# Patient Record
Sex: Female | Born: 1990
Health system: Southern US, Community
[De-identification: ages and names within clinical notes are randomized; demographics above are authoritative.]

## PROBLEM LIST (undated history)

## (undated) DIAGNOSIS — D6859 Other primary thrombophilia: Secondary | ICD-10-CM

## (undated) DIAGNOSIS — I82409 Acute embolism and thrombosis of unspecified deep veins of unspecified lower extremity: Secondary | ICD-10-CM

## (undated) HISTORY — DX: Other primary thrombophilia: D68.59

## (undated) HISTORY — PX: OTHER SURGICAL HISTORY: SHX169

---

## 2012-09-03 HISTORY — DX: Maternal care for unspecified type scar from previous cesarean delivery: O34.219

## 2013-02-27 DIAGNOSIS — I82401 Acute embolism and thrombosis of unspecified deep veins of right lower extremity: Secondary | ICD-10-CM | POA: Insufficient documentation

## 2013-03-03 DIAGNOSIS — Z86718 Personal history of other venous thrombosis and embolism: Secondary | ICD-10-CM | POA: Insufficient documentation

## 2013-03-03 DIAGNOSIS — O34219 Maternal care for unspecified type scar from previous cesarean delivery: Secondary | ICD-10-CM | POA: Insufficient documentation

## 2013-07-11 ENCOUNTER — Encounter (HOSPITAL_COMMUNITY): Payer: Self-pay | Admitting: Emergency Medicine

## 2013-07-11 ENCOUNTER — Emergency Department (HOSPITAL_COMMUNITY)
Admission: EM | Admit: 2013-07-11 | Discharge: 2013-07-11 | Disposition: A | Discharge status: Home / Self Care | Payer: Medicaid Other | Attending: Emergency Medicine | Admitting: Emergency Medicine

## 2013-07-11 DIAGNOSIS — Z86718 Personal history of other venous thrombosis and embolism: Secondary | ICD-10-CM | POA: Insufficient documentation

## 2013-07-11 DIAGNOSIS — O9989 Other specified diseases and conditions complicating pregnancy, childbirth and the puerperium: Secondary | ICD-10-CM | POA: Insufficient documentation

## 2013-07-11 DIAGNOSIS — Z79899 Other long term (current) drug therapy: Secondary | ICD-10-CM | POA: Insufficient documentation

## 2013-07-11 DIAGNOSIS — N898 Other specified noninflammatory disorders of vagina: Secondary | ICD-10-CM | POA: Insufficient documentation

## 2013-07-11 HISTORY — DX: Acute embolism and thrombosis of unspecified deep veins of unspecified lower extremity: I82.409

## 2013-07-11 LAB — URINE MICROSCOPIC-ADD ON

## 2013-07-11 LAB — WET PREP, GENITAL
Clue Cells Wet Prep HPF POC: NONE SEEN
Yeast Wet Prep HPF POC: NONE SEEN

## 2013-07-11 LAB — URINALYSIS, ROUTINE W REFLEX MICROSCOPIC
Bilirubin Urine: NEGATIVE
Glucose, UA: NEGATIVE mg/dL
Hgb urine dipstick: NEGATIVE
Protein, ur: NEGATIVE mg/dL
Urobilinogen, UA: 1 mg/dL (ref 0.0–1.0)

## 2013-07-11 NOTE — ED Provider Notes (Signed)
CSN: [id number]     Arrival date & time 07/11/13  1544 History   First MD Initiated Contact with Patient 07/11/13 1604     Chief Complaint  Patient presents with  . Vaginal Discharge    [redacted] weeks pregnant   (Consider location/radiation/quality/duration/timing/severity/associated sxs/prior Treatment) HPI  22 year old G31P2 female who is [redacted] weeks pregnant presents for evaluations of vaginal discharge. Patient states for the past 2 weeks she has noticed intermittent vaginal discharge when she noticed residue on her underwear. Patient felt that it may related to the pregnancy and the baby's position pressing on her bladder, but she does not think that it is urine. Discharge is usually clear occasionally white. No associated fever, chills, nausea or vomiting, diarrhea, chest pain, shortness of breath, abdominal pain, back pain, dysuria, hematuria, or rash  Past Medical History  Diagnosis Date  . DVT (deep venous thrombosis)    Past Surgical History  Procedure Laterality Date  . Cesearan    . Cesarean section     History reviewed. No pertinent family history. History  Substance Use Topics  . Smoking status: Never Smoker   . Smokeless tobacco: Not on file  . Alcohol Use: No   OB History   Grav Para Term Preterm Abortions TAB SAB Ect Mult Living   1              Review of Systems  All other systems reviewed and are negative.    Allergies  Review of patient's allergies indicates no known allergies.  Home Medications   Current Outpatient Rx  Name  Route  Sig  Dispense  Refill  . enoxaparin (LOVENOX) 40 MG/0.4ML injection   Subcutaneous   Inject 40 mg into the skin daily.         . Prenatal Vit-Fe Fumarate-FA (PRENATAL MULTIVITAMIN) TABS tablet   Oral   Take 1 tablet by mouth daily at 12 noon.          BP 105/58  Pulse 74  Temp(Src) 98.7 F (37.1 C) (Oral)  Resp 18  SpO2 99% Physical Exam  Nursing note and vitals reviewed. Constitutional: She appears  well-developed and well-nourished. No distress.  HENT:  Head: Normocephalic and atraumatic.  Eyes: Conjunctivae are normal.  Neck: Normal range of motion. Neck supple.  Cardiovascular: Normal rate and regular rhythm.   Pulmonary/Chest: Effort normal and breath sounds normal. She exhibits no tenderness.  Abdominal: Soft. There is no tenderness.  Abdomen is gravid  Genitourinary: Vagina normal and uterus normal. There is no rash or lesion on the right labia. There is no rash or lesion on the left labia. Cervix exhibits no motion tenderness and no discharge. Right adnexum displays no mass and no tenderness. Left adnexum displays no mass and no tenderness. No erythema, tenderness or bleeding around the vagina. No vaginal discharge found.  Chaperone present:  Moderate white vaginal discharge in vaginal vault, cervical os closed.  No adnexal tenderness, no CMT.  Lymphadenopathy:       Right: No inguinal adenopathy present.       Left: No inguinal adenopathy present.    ED Course  Procedures (including critical care time)  5:32 PM Pt here c/o vaginal discharge.  Is currently [redacted] weeks pregnant, however no abd pain and no vaginal bleeding.  Pt report she is having normal fetal movement.  Is afebrile, VSS.  Not sexually active for over a month.  Remote hx of Chlamydia.    6:45 PM No pain with pelvic exam.  No evidence to suggest PID.  Does have moderate white curd-like discharge in vaginal vault, no odor.  Wet prep result unremarkable, no evidence to suggest UTI.  Recommend pt f/u with OBGYN for further care.  Return precaution discussed.    Labs Review Labs Reviewed  WET PREP, GENITAL - Abnormal; Notable for the following:    WBC, Wet Prep HPF POC FEW (*)    All other components within normal limits  URINALYSIS, ROUTINE W REFLEX MICROSCOPIC - Abnormal; Notable for the following:    Leukocytes, UA SMALL (*)    All other components within normal limits  PREGNANCY, URINE - Abnormal; Notable  for the following:    Preg Test, Ur POSITIVE (*)    All other components within normal limits  URINE MICROSCOPIC-ADD ON - Abnormal; Notable for the following:    Squamous Epithelial / LPF FEW (*)    Bacteria, UA FEW (*)    All other components within normal limits  GC/CHLAMYDIA PROBE AMP  URINE CULTURE   Imaging Review No results found.  EKG Interpretation   None       MDM   1. Vaginal discharge in pregnancy, second trimester    BP 105/58  Pulse 74  Temp(Src) 98.7 F (37.1 C) (Oral)  Resp 18  SpO2 99%  I have reviewed nursing notes and vital signs.  I reviewed available ER/hospitalization records thought the EMR     Fayrene Helper, [place] 07/11/13 1846

## 2013-07-11 NOTE — ED Notes (Signed)
Pt is [redacted] weeks pregnant.  Pt states she is noticing clear drainage in her underwear.  Changes 1 panty-liner per day.  Denies urinary s/s.  Denies abdominal pain.  Pt has not been to her ob b/c she is here visiting.

## 2013-07-12 NOTE — ED Provider Notes (Signed)
Medical screening examination/treatment/procedure(s) were performed by non-physician practitioner and as supervising physician I was immediately available for consultation/collaboration.  EKG Interpretation   None         Darlys Gales, MD 07/12/13 [phone]

## 2013-07-13 LAB — GC/CHLAMYDIA PROBE AMP
CT Probe RNA: NEGATIVE
GC Probe RNA: NEGATIVE

## 2013-07-13 LAB — URINE CULTURE

## 2013-09-04 DIAGNOSIS — Z8619 Personal history of other infectious and parasitic diseases: Secondary | ICD-10-CM | POA: Insufficient documentation

## 2013-09-23 DIAGNOSIS — Z98891 History of uterine scar from previous surgery: Secondary | ICD-10-CM

## 2013-09-23 HISTORY — DX: History of uterine scar from previous surgery: Z98.891

## 2013-09-25 DIAGNOSIS — O0990 Supervision of high risk pregnancy, unspecified, unspecified trimester: Secondary | ICD-10-CM | POA: Insufficient documentation

## 2013-12-08 DIAGNOSIS — Z30017 Encounter for initial prescription of implantable subdermal contraceptive: Secondary | ICD-10-CM | POA: Insufficient documentation

## 2014-04-06 ENCOUNTER — Emergency Department (HOSPITAL_COMMUNITY)
Admission: EM | Admit: 2014-04-06 | Discharge: 2014-04-06 | Disposition: A | Discharge status: Home / Self Care | Payer: Managed Care, Other (non HMO) | Attending: Emergency Medicine | Admitting: Emergency Medicine

## 2014-04-06 ENCOUNTER — Encounter (HOSPITAL_COMMUNITY): Payer: Self-pay | Admitting: Emergency Medicine

## 2014-04-06 DIAGNOSIS — Z86718 Personal history of other venous thrombosis and embolism: Secondary | ICD-10-CM | POA: Insufficient documentation

## 2014-04-06 DIAGNOSIS — Z79899 Other long term (current) drug therapy: Secondary | ICD-10-CM | POA: Insufficient documentation

## 2014-04-06 DIAGNOSIS — R300 Dysuria: Secondary | ICD-10-CM | POA: Insufficient documentation

## 2014-04-06 DIAGNOSIS — Z792 Long term (current) use of antibiotics: Secondary | ICD-10-CM | POA: Insufficient documentation

## 2014-04-06 DIAGNOSIS — Z3202 Encounter for pregnancy test, result negative: Secondary | ICD-10-CM | POA: Insufficient documentation

## 2014-04-06 DIAGNOSIS — N390 Urinary tract infection, site not specified: Secondary | ICD-10-CM | POA: Insufficient documentation

## 2014-04-06 LAB — URINALYSIS, ROUTINE W REFLEX MICROSCOPIC
BILIRUBIN URINE: NEGATIVE
Glucose, UA: NEGATIVE mg/dL
Ketones, ur: NEGATIVE mg/dL
Nitrite: POSITIVE — AB
PH: 6 (ref 5.0–8.0)
Protein, ur: 30 mg/dL — AB
SPECIFIC GRAVITY, URINE: 1.025 (ref 1.005–1.030)
Urobilinogen, UA: 0.2 mg/dL (ref 0.0–1.0)

## 2014-04-06 LAB — URINE MICROSCOPIC-ADD ON

## 2014-04-06 LAB — PREGNANCY, URINE: Preg Test, Ur: NEGATIVE

## 2014-04-06 MED ORDER — CEPHALEXIN 500 MG PO CAPS
500.0000 mg | ORAL_CAPSULE | Freq: Once | ORAL | Status: AC
  Administered 2014-04-06: 500 mg via ORAL
  Filled 2014-04-06: qty 1

## 2014-04-06 MED ORDER — CEPHALEXIN 500 MG PO CAPS: 500.0000 mg | ORAL_CAPSULE | Freq: Two times a day (BID) | ORAL | Status: DC

## 2014-04-06 NOTE — ED Provider Notes (Signed)
Medical screening examination/treatment/procedure(s) were performed by non-physician practitioner and as supervising physician I was immediately available for consultation/collaboration.   EKG Interpretation None      Devoria AlbeIva Madinah Quarry, MD, Armando GangFACEP   Ward GivensIva L Elie Gragert, MD 04/06/14 Mikle [id number]

## 2014-04-06 NOTE — ED Provider Notes (Signed)
CSN: [id number]     Arrival date & time 04/06/14  1750 History   First MD Initiated Contact with Patient 04/06/14 1804     Chief Complaint  Patient presents with  . Dysuria     (Consider location/radiation/quality/duration/timing/severity/associated sxs/prior Treatment) HPI   Brandy Barker is a 23 y.o. female presenting with a 1 week history or dysuria,  Describing increased urinary frequency, burning pain with urination, urgency and never feeling she has completely emptied her bladder.  She has taken aso with transient relief yesterday.  She denies fevers or chills, hematuria, nausea, vomiting, abdominal pain, back pain and vaginal discharge.  She is currently menstruating and denies pregnancy.    Past Medical History  Diagnosis Date  . DVT (deep venous thrombosis)    Past Surgical History  Procedure Laterality Date  . Cesearan    . Cesarean section     History reviewed. No pertinent family history. History  Substance Use Topics  . Smoking status: Never Smoker   . Smokeless tobacco: Not on file  . Alcohol Use: No   OB History   Grav Para Term Preterm Abortions TAB SAB Ect Mult Living   1              Review of Systems  Constitutional: Negative for fever.  HENT: Negative for congestion and sore throat.   Eyes: Negative.   Respiratory: Negative for chest tightness and shortness of breath.   Cardiovascular: Negative for chest pain.  Gastrointestinal: Negative for nausea and abdominal pain.  Genitourinary: Positive for dysuria, urgency and frequency. Negative for hematuria, vaginal discharge and pelvic pain.  Musculoskeletal: Negative for arthralgias, joint swelling and neck pain.  Skin: Negative.  Negative for rash and wound.  Neurological: Negative for dizziness, weakness, light-headedness, numbness and headaches.  Psychiatric/Behavioral: Negative.       Allergies  Review of patient's allergies indicates no known allergies.  Home Medications   Prior to  Admission medications   Medication Sig Start Date End Date Taking? Authorizing Provider  cephALEXin (KEFLEX) 500 MG capsule Take 1 capsule (500 mg total) by mouth 2 (two) times daily. 04/06/14   Burgess Amor, PA-C  enoxaparin (LOVENOX) 40 MG/0.4ML injection Inject 40 mg into the skin daily.    Historical Provider, MD  Prenatal Vit-Fe Fumarate-FA (PRENATAL MULTIVITAMIN) TABS tablet Take 1 tablet by mouth daily at 12 noon.    Historical Provider, MD   BP 127/74  Pulse 77  Temp(Src) 98.5 F (36.9 C) (Oral)  Resp 20  Ht 5\' 3"  (1.6 m)  Wt 175 lb (79.379 kg)  BMI 31.01 kg/m2  SpO2 100%  LMP 04/04/2014  Breastfeeding? Unknown Physical Exam  Nursing note and vitals reviewed. Constitutional: She appears well-developed and well-nourished.  HENT:  Head: Normocephalic and atraumatic.  Eyes: Conjunctivae are normal.  Neck: Normal range of motion.  Cardiovascular: Normal rate, regular rhythm, normal heart sounds and intact distal pulses.   Pulmonary/Chest: Effort normal and breath sounds normal. She has no wheezes.  Abdominal: Soft. Bowel sounds are normal. She exhibits no distension. There is no tenderness. There is no rebound and no guarding.  Musculoskeletal: Normal range of motion.  Neurological: She is alert.  Skin: Skin is warm and dry.  Psychiatric: She has a normal mood and affect.    ED Course  Procedures (including critical care time) Labs Review Labs Reviewed  URINALYSIS, ROUTINE W REFLEX MICROSCOPIC - Abnormal; Notable for the following:    APPearance HAZY (*)    Hgb urine dipstick  MODERATE (*)    Protein, ur 30 (*)    Nitrite POSITIVE (*)    Leukocytes, UA SMALL (*)    All other components within normal limits  URINE MICROSCOPIC-ADD ON - Abnormal; Notable for the following:    Squamous Epithelial / LPF MANY (*)    Bacteria, UA MANY (*)    All other components within normal limits  URINE CULTURE  PREGNANCY, URINE    Imaging Review No results found.   EKG  Interpretation None      MDM   Final diagnoses:  UTI (lower urinary tract infection)    Urine culture ordered.  Keflex prescribed, first dose given here.  Referrals given for obtaining pcp, discussed return precautions.    Patients labs and/or radiological studies were viewed and considered during the medical decision making and disposition process.     Burgess AmorJulie Giuliana Handyside, PA-C 04/06/14 1858

## 2014-04-06 NOTE — ED Notes (Signed)
Dysuria, frequency of urination.  No NV or fever.

## 2014-04-06 NOTE — Discharge Instructions (Signed)

## 2014-04-09 LAB — URINE CULTURE

## 2014-04-10 ENCOUNTER — Telehealth (HOSPITAL_BASED_OUTPATIENT_CLINIC_OR_DEPARTMENT_OTHER): Payer: Self-pay | Admitting: Emergency Medicine

## 2014-04-10 NOTE — Telephone Encounter (Signed)
Post ED Visit - Positive Culture Follow-up  Culture report reviewed by antimicrobial stewardship pharmacist: []  Wes Dulaney, Pharm.D., BCPS []  Celedonio MiyamotoJeremy Frens, Pharm.D., BCPS []  Georgina PillionElizabeth Martin, Pharm.D., BCPS []  DurantMinh Pham, [address] Rainbow BoulevardPharm.D., BCPS, AAHIVP [x]  Estella HuskMichelle Turner, Pharm.D., BCPS, AAHIVP []  Red ChristiansSamson Lee, Pharm.D. []  Tennis Mustassie Stewart, Pharm.D.  Positive urine culture Treated with Keflex, organism sensitive to the same and no further patient follow-up is required at this time.  Marcelle OverlieHolland, Jenel LucksKylie 04/10/2014, 10:26 AM

## 2014-05-24 ENCOUNTER — Encounter (HOSPITAL_COMMUNITY): Payer: Self-pay | Admitting: Emergency Medicine

## 2014-05-24 ENCOUNTER — Emergency Department (HOSPITAL_COMMUNITY)
Admission: EM | Admit: 2014-05-24 | Discharge: 2014-05-24 | Disposition: A | Discharge status: Home / Self Care | Payer: Managed Care, Other (non HMO) | Attending: Emergency Medicine | Admitting: Emergency Medicine

## 2014-05-24 DIAGNOSIS — A599 Trichomoniasis, unspecified: Secondary | ICD-10-CM

## 2014-05-24 DIAGNOSIS — Z86718 Personal history of other venous thrombosis and embolism: Secondary | ICD-10-CM | POA: Insufficient documentation

## 2014-05-24 DIAGNOSIS — B9689 Other specified bacterial agents as the cause of diseases classified elsewhere: Secondary | ICD-10-CM | POA: Diagnosis not present

## 2014-05-24 DIAGNOSIS — N760 Acute vaginitis: Secondary | ICD-10-CM | POA: Diagnosis not present

## 2014-05-24 DIAGNOSIS — N898 Other specified noninflammatory disorders of vagina: Secondary | ICD-10-CM | POA: Diagnosis present

## 2014-05-24 DIAGNOSIS — A499 Bacterial infection, unspecified: Secondary | ICD-10-CM | POA: Insufficient documentation

## 2014-05-24 DIAGNOSIS — A5901 Trichomonal vulvovaginitis: Secondary | ICD-10-CM | POA: Insufficient documentation

## 2014-05-24 DIAGNOSIS — Z79899 Other long term (current) drug therapy: Secondary | ICD-10-CM | POA: Diagnosis not present

## 2014-05-24 DIAGNOSIS — Z792 Long term (current) use of antibiotics: Secondary | ICD-10-CM | POA: Diagnosis not present

## 2014-05-24 DIAGNOSIS — Z3202 Encounter for pregnancy test, result negative: Secondary | ICD-10-CM | POA: Diagnosis not present

## 2014-05-24 DIAGNOSIS — Z9889 Other specified postprocedural states: Secondary | ICD-10-CM | POA: Insufficient documentation

## 2014-05-24 LAB — URINALYSIS, ROUTINE W REFLEX MICROSCOPIC
Bilirubin Urine: NEGATIVE
GLUCOSE, UA: NEGATIVE mg/dL
KETONES UR: NEGATIVE mg/dL
LEUKOCYTES UA: NEGATIVE
NITRITE: NEGATIVE
PROTEIN: NEGATIVE mg/dL
Specific Gravity, Urine: 1.025 (ref 1.005–1.030)
Urobilinogen, UA: 0.2 mg/dL (ref 0.0–1.0)
pH: 6 (ref 5.0–8.0)

## 2014-05-24 LAB — WET PREP, GENITAL: YEAST WET PREP: NONE SEEN

## 2014-05-24 LAB — URINE MICROSCOPIC-ADD ON

## 2014-05-24 LAB — PREGNANCY, URINE: PREG TEST UR: NEGATIVE

## 2014-05-24 MED ORDER — METRONIDAZOLE 500 MG PO TABS
2000.0000 mg | ORAL_TABLET | Freq: Once | ORAL | Status: AC
  Administered 2014-05-24: 2000 mg via ORAL
  Filled 2014-05-24: qty 4

## 2014-05-24 NOTE — ED Notes (Signed)
Pt c/o thicker foul smelling discharge and pelvic pain x 1 weeks.

## 2014-05-24 NOTE — Discharge Instructions (Signed)
Bacterial Vaginosis °Bacterial vaginosis is a vaginal infection that occurs when the normal balance of bacteria in the vagina is disrupted. It results from an overgrowth of certain bacteria. This is the most common vaginal infection in women of childbearing age. Treatment is important to prevent complications, especially in pregnant women, as it can cause a premature delivery. °CAUSES  °Bacterial vaginosis is caused by an increase in harmful bacteria that are normally present in smaller amounts in the vagina. Several different kinds of bacteria can cause bacterial vaginosis. However, the reason that the condition develops is not fully understood. °RISK FACTORS °Certain activities or behaviors can put you at an increased risk of developing bacterial vaginosis, including: °· Having a new sex partner or multiple sex partners. °· Douching. °· Using an intrauterine device (IUD) for contraception. °Women do not get bacterial vaginosis from toilet seats, bedding, swimming pools, or contact with objects around them. °SIGNS AND SYMPTOMS  °Some women with bacterial vaginosis have no signs or symptoms. Common symptoms include: °· Grey vaginal discharge. °· A fishlike odor with discharge, especially after sexual intercourse. °· Itching or burning of the vagina and vulva. °· Burning or pain with urination. °DIAGNOSIS  °Your health care provider will take a medical history and examine the vagina for signs of bacterial vaginosis. A sample of vaginal fluid may be taken. Your health care provider will look at this sample under a microscope to check for bacteria and abnormal cells. A vaginal pH test may also be done.  °TREATMENT  °Bacterial vaginosis may be treated with antibiotic medicines. These may be given in the form of a pill or a vaginal cream. A second round of antibiotics may be prescribed if the condition comes back after treatment.  °HOME CARE INSTRUCTIONS  °· Only take over-the-counter or prescription medicines as  directed by your health care provider. °· If antibiotic medicine was prescribed, take it as directed. Make sure you finish it even if you start to feel better. °· Do not have sex until treatment is completed. °· Tell all sexual partners that you have a vaginal infection. They should see their health care provider and be treated if they have problems, such as a mild rash or itching. °· Practice safe sex by using condoms and only having one sex partner. °SEEK MEDICAL CARE IF:  °· Your symptoms are not improving after 3 days of treatment. °· You have increased discharge or pain. °· You have a fever. °MAKE SURE YOU:  °· Understand these instructions. °· Will watch your condition. °· Will get help right away if you are not doing well or get worse. °FOR MORE INFORMATION  °Centers for Disease Control and Prevention, Division of STD Prevention: www.cdc.gov/std °American Sexual Health Association (ASHA): www.ashastd.org  °Document Released: 08/20/2005 Document Revised: 06/10/2013 Document Reviewed: 04/01/2013 °ExitCare® Patient Information ©2015 [employer]. This information is not intended to replace advice given to you by your health care provider. Make sure you discuss any questions you have with your health care provider. °Trichomoniasis °Trichomoniasis is an infection caused by an organism called Trichomonas. The infection can affect both women and men. In women, the outer female genitalia and the vagina are affected. In men, the penis is mainly affected, but the prostate and other reproductive organs can also be involved. Trichomoniasis is a sexually transmitted infection (STI) and is most often passed to another person through sexual contact.  °RISK FACTORS °· Having unprotected sexual intercourse. °· Having sexual intercourse with an infected partner. °SIGNS AND SYMPTOMS  °  Symptoms of trichomoniasis in women include: °· Abnormal gray-green frothy vaginal discharge. °· Itching and irritation of the  vagina. °· Itching and irritation of the area outside the vagina. °Symptoms of trichomoniasis in men include:  °· Penile discharge with or without pain. °· Pain during urination. This results from inflammation of the urethra. °DIAGNOSIS  °Trichomoniasis may be found during a Pap test or physical exam. Your health care provider may use one of the following methods to help diagnose this infection: °· Examining vaginal discharge under a microscope. For men, urethral discharge would be examined. °· Testing the pH of the vagina with a test tape. °· Using a vaginal swab test that checks for the Trichomonas organism. A test is available that provides results within a few minutes. °· Doing a culture test for the organism. This is not usually needed. °TREATMENT  °· You may be given medicine to fight the infection. Women should inform their health care provider if they could be or are pregnant. Some medicines used to treat the infection should not be taken during pregnancy. °· Your health care provider may recommend over-the-counter medicines or creams to decrease itching or irritation. °· Your sexual partner will need to be treated if infected. °HOME CARE INSTRUCTIONS  °· Take medicines only as directed by your health care provider. °· Take over-the-counter medicine for itching or irritation as directed by your health care provider. °· Do not have sexual intercourse while you have the infection. °· Women should not douche or wear tampons while they have the infection. °· Discuss your infection with your partner. Your partner may have gotten the infection from you, or you may have gotten it from your partner. °· Have your sex partner get examined and treated if necessary. °· Practice safe, informed, and protected sex. °· See your health care provider for other STI testing. °SEEK MEDICAL CARE IF:  °· You still have symptoms after you finish your medicine. °· You develop abdominal pain. °· You have pain when you urinate. °· You  have bleeding after sexual intercourse. °· You develop a rash. °· Your medicine makes you sick or makes you throw up (vomit). °MAKE SURE YOU: °· Understand these instructions. °· Will watch your condition. °· Will get help right away if you are not doing well or get worse. °Document Released: 02/13/2001 Document Revised: 01/04/2014 Document Reviewed: 06/01/2013 °ExitCare® Patient Information ©2015 [employer]. This information is not intended to replace advice given to you by your health care provider. Make sure you discuss any questions you have with your health care provider. ° °

## 2014-05-24 NOTE — ED Provider Notes (Signed)
CSN: [id number]     Arrival date & time 05/24/14  1741 History   First MD Initiated Contact with Patient 05/24/14 1758     Chief Complaint  Patient presents with  . Vaginal Discharge     (Consider location/radiation/quality/duration/timing/severity/associated sxs/prior Treatment) HPI Comments: Patient complains of two-week history of foul-smelling vaginal discharge with fishy odor. This is similar to when she had bacterial vaginosis. She endorses intermittent lower abdominal cramps similar to her menstrual cramps. Denies any fever, chills, nausea or vomiting. Good by mouth intake and urine output. No change in bowel habits. She has a history of DVT. No longer on Lovenox. She is actually active with her boyfriend only. Denies any abnormal vaginal bleeding.  The history is provided by the patient.    Past Medical History  Diagnosis Date  . DVT (deep venous thrombosis)    Past Surgical History  Procedure Laterality Date  . Cesearan    . Cesarean section     No family history on file. History  Substance Use Topics  . Smoking status: Never Smoker   . Smokeless tobacco: Not on file  . Alcohol Use: No   OB History   Grav Para Term Preterm Abortions TAB SAB Ect Mult Living   1              Review of Systems  Constitutional: Negative for fever, activity change and appetite change.  Respiratory: Negative for cough, chest tightness and shortness of breath.   Cardiovascular: Negative for chest pain.  Gastrointestinal: Positive for abdominal pain. Negative for nausea and vomiting.  Genitourinary: Positive for vaginal discharge. Negative for dysuria and hematuria.  Musculoskeletal: Negative for arthralgias, back pain and myalgias.  Skin: Negative for rash.  Neurological: Negative for dizziness, weakness and headaches.  A complete 10 system review of systems was obtained and all systems are negative except as noted in the HPI and PMH.      Allergies  Review of patient's allergies  indicates no known allergies.  Home Medications   Prior to Admission medications   Medication Sig Start Date End Date Taking? Authorizing Provider  cephALEXin (KEFLEX) 500 MG capsule Take 1 capsule (500 mg total) by mouth 2 (two) times daily. 04/06/14   Burgess Amor, PA-C  enoxaparin (LOVENOX) 40 MG/0.4ML injection Inject 40 mg into the skin daily.    Historical Provider, MD  Prenatal Vit-Fe Fumarate-FA (PRENATAL MULTIVITAMIN) TABS tablet Take 1 tablet by mouth daily at 12 noon.    Historical Provider, MD   BP 133/82  Pulse 70  Temp(Src) 99 F (37.2 C)  Resp 18  Ht  (1.6 m)  Wt 176 lb (79.833 kg)  BMI 31.18 kg/m2  SpO2 100%  LMP 05/04/2014 Physical Exam  Nursing note and vitals reviewed. Constitutional: She is oriented to person, place, and time. She appears well-developed and well-nourished. No distress.  HENT:  Head: Normocephalic and atraumatic.  Mouth/Throat: Oropharynx is clear and moist. No oropharyngeal exudate.  Eyes: Conjunctivae and EOM are normal. Pupils are equal, round, and reactive to light.  Neck: Normal range of motion. Neck supple.  No meningismus.  Cardiovascular: Normal rate, regular rhythm, normal heart sounds and intact distal pulses.   No murmur heard. Pulmonary/Chest: Effort normal and breath sounds normal. No respiratory distress.  Abdominal: Soft. There is no tenderness. There is no rebound and no guarding.  No pain at McBurney's point.  Genitourinary:  Chaperone present. Normal external genitalia. No CMT. No adnexal tenderness. Minimal white discharge  Musculoskeletal:  Normal range of motion. She exhibits no edema and no tenderness.  Neurological: She is alert and oriented to person, place, and time. No cranial nerve deficit. She exhibits normal muscle tone. Coordination normal.  No ataxia on finger to nose bilaterally. No pronator drift. 5/5 strength throughout. CN 2-12 intact. Negative Romberg. Equal grip strength. Sensation intact. Gait is normal.    Skin: Skin is warm.  Psychiatric: She has a normal mood and affect. Her behavior is normal.    ED Course  Procedures (including critical care time) Labs Review Labs Reviewed  WET PREP, GENITAL - Abnormal; Notable for the following:    Trich, Wet Prep FEW (*)    Clue Cells Wet Prep HPF POC FEW (*)    WBC, Wet Prep HPF POC FEW (*)    All other components within normal limits  URINALYSIS, ROUTINE W REFLEX MICROSCOPIC - Abnormal; Notable for the following:    Hgb urine dipstick TRACE (*)    All other components within normal limits  URINE MICROSCOPIC-ADD ON - Abnormal; Notable for the following:    Squamous Epithelial / LPF FEW (*)    All other components within normal limits  GC/CHLAMYDIA PROBE AMP  PREGNANCY, URINE    Imaging Review No results found.   EKG Interpretation None      MDM   Final diagnoses:  Bacterial vaginosis  Trichomonas infection   2 weeks of vaginal discharge. No significant pain. Pelvic exam benign. No pain at McBurney's point.  Will treat for Trichomonas and bacterial vaginosis. Patient is not breast-feeding. She is given 2 g of Flagyl by mouth.  UA is negative. HCG negative.  Patient to follow up with PCP. Return precautions discussed.     Glynn Octave, MD 05/24/14 2159

## 2014-05-26 LAB — GC/CHLAMYDIA PROBE AMP
CT Probe RNA: NEGATIVE
GC Probe RNA: NEGATIVE

## 2014-07-05 ENCOUNTER — Encounter (HOSPITAL_COMMUNITY): Payer: Self-pay | Admitting: Emergency Medicine

## 2014-08-03 ENCOUNTER — Encounter (HOSPITAL_COMMUNITY): Payer: Self-pay | Admitting: Emergency Medicine

## 2014-08-03 ENCOUNTER — Emergency Department (HOSPITAL_COMMUNITY)
Admission: EM | Admit: 2014-08-03 | Discharge: 2014-08-03 | Disposition: A | Discharge status: Home / Self Care | Payer: Managed Care, Other (non HMO) | Attending: Emergency Medicine | Admitting: Emergency Medicine

## 2014-08-03 DIAGNOSIS — Z86718 Personal history of other venous thrombosis and embolism: Secondary | ICD-10-CM | POA: Insufficient documentation

## 2014-08-03 DIAGNOSIS — Z3202 Encounter for pregnancy test, result negative: Secondary | ICD-10-CM | POA: Insufficient documentation

## 2014-08-03 DIAGNOSIS — N760 Acute vaginitis: Secondary | ICD-10-CM | POA: Insufficient documentation

## 2014-08-03 DIAGNOSIS — B9689 Other specified bacterial agents as the cause of diseases classified elsewhere: Secondary | ICD-10-CM

## 2014-08-03 DIAGNOSIS — Z792 Long term (current) use of antibiotics: Secondary | ICD-10-CM | POA: Insufficient documentation

## 2014-08-03 DIAGNOSIS — N898 Other specified noninflammatory disorders of vagina: Secondary | ICD-10-CM | POA: Diagnosis present

## 2014-08-03 LAB — URINALYSIS, ROUTINE W REFLEX MICROSCOPIC
Bilirubin Urine: NEGATIVE
Glucose, UA: NEGATIVE mg/dL
Hgb urine dipstick: NEGATIVE
Ketones, ur: 15 mg/dL — AB
LEUKOCYTES UA: NEGATIVE
Nitrite: NEGATIVE
PROTEIN: NEGATIVE mg/dL
Specific Gravity, Urine: 1.025 (ref 1.005–1.030)
Urobilinogen, UA: 1 mg/dL (ref 0.0–1.0)
pH: 7 (ref 5.0–8.0)

## 2014-08-03 LAB — WET PREP, GENITAL
Trich, Wet Prep: NONE SEEN
YEAST WET PREP: NONE SEEN

## 2014-08-03 LAB — PREGNANCY, URINE: Preg Test, Ur: NEGATIVE

## 2014-08-03 MED ORDER — METRONIDAZOLE 500 MG PO TABS: 500.0000 mg | ORAL_TABLET | Freq: Two times a day (BID) | ORAL | Status: DC

## 2014-08-03 NOTE — ED Notes (Signed)
Pt reports vaginal discharge and odor x2 weeks. Pt reports history of same. Pt denies any fever,n/v,abdominal pain.

## 2014-08-03 NOTE — Discharge Instructions (Signed)
Bacterial Vaginosis Bacterial vaginosis is a vaginal infection that occurs when the normal balance of bacteria in the vagina is disrupted. It results from an overgrowth of certain bacteria. This is the most common vaginal infection in women of childbearing age. Treatment is important to prevent complications, especially in pregnant women, as it can cause a premature delivery. CAUSES  Bacterial vaginosis is caused by an increase in harmful bacteria that are normally present in smaller amounts in the vagina. Several different kinds of bacteria can cause bacterial vaginosis. However, the reason that the condition develops is not fully understood. RISK FACTORS Certain activities or behaviors can put you at an increased risk of developing bacterial vaginosis, including:  Having a new sex partner or multiple sex partners.  Douching.  Using an intrauterine device (IUD) for contraception. Women do not get bacterial vaginosis from toilet seats, bedding, swimming pools, or contact with objects around them. SIGNS AND SYMPTOMS  Some women with bacterial vaginosis have no signs or symptoms. Common symptoms include:  Grey vaginal discharge.  A fishlike odor with discharge, especially after sexual intercourse.  Itching or burning of the vagina and vulva.  Burning or pain with urination. DIAGNOSIS  Your health care provider will take a medical history and examine the vagina for signs of bacterial vaginosis. A sample of vaginal fluid may be taken. Your health care provider will look at this sample under a microscope to check for bacteria and abnormal cells. A vaginal pH test may also be done.  TREATMENT  Bacterial vaginosis may be treated with antibiotic medicines. These may be given in the form of a pill or a vaginal cream. A second round of antibiotics may be prescribed if the condition comes back after treatment.  HOME CARE INSTRUCTIONS   Only take over-the-counter or prescription medicines as  directed by your health care provider.  If antibiotic medicine was prescribed, take it as directed. Make sure you finish it even if you start to feel better.  Do not have sex until treatment is completed.  Tell all sexual partners that you have a vaginal infection. They should see their health care provider and be treated if they have problems, such as a mild rash or itching.  Practice safe sex by using condoms and only having one sex partner. SEEK MEDICAL CARE IF:   Your symptoms are not improving after 3 days of treatment.  You have increased discharge or pain.  You have a fever. MAKE SURE YOU:   Understand these instructions.  Will watch your condition.  Will get help right away if you are not doing well or get worse. FOR MORE INFORMATION  Centers for Disease Control and Prevention, Division of STD Prevention: www.cdc.gov/std American Sexual Health Association (ASHA): www.ashastd.org  Document Released: 08/20/2005 Document Revised: 06/10/2013 Document Reviewed: 04/01/2013 ExitCare Patient Information 2015 [employer]. This information is not intended to replace advice given to you by your health care provider. Make sure you discuss any questions you have with your health care provider.  

## 2014-08-03 NOTE — ED Provider Notes (Signed)
CSN: [id number]     Arrival date & time 08/03/14  1140 History   First MD Initiated Contact with Patient 08/03/14 1151     Chief Complaint  Patient presents with  . Vaginal Discharge     (Consider location/radiation/quality/duration/timing/severity/associated sxs/prior Treatment) HPI Comments: Comes in with complaints of vaginal discharge and odor times 2 weeks. Pt states that it is similar to previous episodes of bv. Denies abdominal pain or dysuria. Previous history of std a couple of months ago.   The history is provided by the patient. No language interpreter was used.    Past Medical History  Diagnosis Date  . DVT (deep venous thrombosis)    Past Surgical History  Procedure Laterality Date  . Cesearan    . Cesarean section     History reviewed. No pertinent family history. History  Substance Use Topics  . Smoking status: Never Smoker   . Smokeless tobacco: Not on file  . Alcohol Use: No   OB History    Gravida Para Term Preterm AB TAB SAB Ectopic Multiple Living   1              Review of Systems  All other systems reviewed and are negative.     Allergies  Review of patient's allergies indicates no known allergies.  Home Medications   Prior to Admission medications   Medication Sig Start Date End Date Taking? Authorizing Provider  etonogestrel (NEXPLANON) 68 MG IMPL implant 1 each by Subdermal route once.   Yes Historical Provider, MD  cephALEXin (KEFLEX) 500 MG capsule Take 1 capsule (500 mg total) by mouth 2 (two) times daily. Patient not taking: Reported on 08/03/2014 04/06/14   Burgess AmorJulie Idol, PA-C   BP 111/82 mmHg  Pulse 78  Temp(Src) 98.8 F (37.1 C) (Oral)  Resp 18  Ht 5\' 3"  (1.6 m)  Wt 175 lb (79.379 kg)  BMI 31.01 kg/m2  SpO2 100%  Breastfeeding? No Physical Exam  Constitutional: She is oriented to person, place, and time. She appears well-developed and well-nourished.  Cardiovascular: Normal rate and regular rhythm.   Pulmonary/Chest: Effort  normal.  Genitourinary:  Malodorous discharge  Musculoskeletal: Normal range of motion.  Neurological: She is alert and oriented to person, place, and time.  Skin: Skin is warm and dry.  Psychiatric: She has a normal mood and affect.  Nursing note and vitals reviewed.   ED Course  Procedures (including critical care time) Labs Review Labs Reviewed  WET PREP, GENITAL - Abnormal; Notable for the following:    Clue Cells Wet Prep HPF POC MANY (*)    WBC, Wet Prep HPF POC FEW (*)    All other components within normal limits  URINALYSIS, ROUTINE W REFLEX MICROSCOPIC - Abnormal; Notable for the following:    Ketones, ur 15 (*)    All other components within normal limits  GC/CHLAMYDIA PROBE AMP  PREGNANCY, URINE    Imaging Review No results found.   EKG Interpretation None      MDM   Final diagnoses:  BV (bacterial vaginosis)    Will treat for bv. Cultures sent    Teressa LowerVrinda Gaelyn Tukes, NP 08/03/14 1504  Benny LennertJoseph L Zammit, MD 08/03/14 [phone]

## 2014-08-04 LAB — GC/CHLAMYDIA PROBE AMP
CT Probe RNA: NEGATIVE
GC Probe RNA: NEGATIVE

## 2014-09-03 DIAGNOSIS — D6859 Other primary thrombophilia: Secondary | ICD-10-CM

## 2014-09-03 HISTORY — DX: Other primary thrombophilia: D68.59

## 2014-11-30 ENCOUNTER — Encounter (HOSPITAL_COMMUNITY): Payer: Self-pay

## 2014-11-30 ENCOUNTER — Emergency Department (HOSPITAL_COMMUNITY)
Admission: EM | Admit: 2014-11-30 | Discharge: 2014-11-30 | Disposition: A | Discharge status: Home / Self Care | Payer: Managed Care, Other (non HMO) | Attending: Emergency Medicine | Admitting: Emergency Medicine

## 2014-11-30 DIAGNOSIS — Z86718 Personal history of other venous thrombosis and embolism: Secondary | ICD-10-CM | POA: Insufficient documentation

## 2014-11-30 DIAGNOSIS — G44319 Acute post-traumatic headache, not intractable: Secondary | ICD-10-CM | POA: Diagnosis not present

## 2014-11-30 DIAGNOSIS — Z793 Long term (current) use of hormonal contraceptives: Secondary | ICD-10-CM | POA: Insufficient documentation

## 2014-11-30 DIAGNOSIS — Z87828 Personal history of other (healed) physical injury and trauma: Secondary | ICD-10-CM | POA: Diagnosis not present

## 2014-11-30 DIAGNOSIS — R51 Headache: Secondary | ICD-10-CM | POA: Diagnosis present

## 2014-11-30 DIAGNOSIS — Z87891 Personal history of nicotine dependence: Secondary | ICD-10-CM | POA: Diagnosis not present

## 2014-11-30 DIAGNOSIS — Z79899 Other long term (current) drug therapy: Secondary | ICD-10-CM | POA: Insufficient documentation

## 2014-11-30 MED ORDER — IBUPROFEN 600 MG PO TABS: 600.0000 mg | ORAL_TABLET | Freq: Four times a day (QID) | ORAL | Status: DC | PRN

## 2014-11-30 MED ORDER — ACETAMINOPHEN 500 MG PO TABS
1000.0000 mg | ORAL_TABLET | Freq: Once | ORAL | Status: AC
  Administered 2014-11-30: 1000 mg via ORAL
  Filled 2014-11-30: qty 2

## 2014-11-30 MED ORDER — IBUPROFEN 800 MG PO TABS
800.0000 mg | ORAL_TABLET | Freq: Once | ORAL | Status: AC
  Administered 2014-11-30: 800 mg via ORAL
  Filled 2014-11-30: qty 1
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
## 2014-11-30 NOTE — Discharge Instructions (Signed)
Head Injury You have a head injury. Headaches and throwing up (vomiting) are common after a head injury. It should be easy to wake up from sleeping. Sometimes you must stay in the hospital. Most problems happen within the first 24 hours. Side effects may occur up to 7-10 days after the injury.  WHAT ARE THE TYPES OF HEAD INJURIES? Head injuries can be as minor as a bump. Some head injuries can be more severe. More severe head injuries include:  A jarring injury to the brain (concussion).  A bruise of the brain (contusion). This mean there is bleeding in the brain that can cause swelling.  A cracked skull (skull fracture).  Bleeding in the brain that collects, clots, and forms a bump (hematoma). WHEN SHOULD I GET HELP RIGHT AWAY?   You are confused or sleepy.  You cannot be woken up.  You feel sick to your stomach (nauseous) or keep throwing up (vomiting).  Your dizziness or unsteadiness is getting worse.  You have very bad, lasting headaches that are not helped by medicine. Take medicines only as told by your doctor.  You cannot use your arms or legs like normal.  You cannot walk.  You notice changes in the black spots in the center of the colored part of your eye (pupil).  You have clear or bloody fluid coming from your nose or ears.  You have trouble seeing. During the next 24 hours after the injury, you must stay with someone who can watch you. This person should get help right away (call 911 in the [place] if you start to shake and are not able to control it (have seizures), you pass out, or you are unable to wake up. HOW CAN I PREVENT A HEAD INJURY IN THE FUTURE?  Wear seat belts.  Wear a helmet while bike riding and playing sports like football.  Stay away from dangerous activities around the house. WHEN CAN I RETURN TO NORMAL ACTIVITIES AND ATHLETICS? See your doctor before doing these activities. You should not do normal activities or play contact sports until 1 week  after the following symptoms have stopped:  Headache that does not go away.  Dizziness.  Poor attention.  Confusion.  Memory problems.  Sickness to your stomach or throwing up.  Tiredness.  Fussiness.  Bothered by bright lights or loud noises.  Anxiousness or depression.  Restless sleep.
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
## 2015-01-18 ENCOUNTER — Encounter (HOSPITAL_COMMUNITY): Payer: Self-pay

## 2015-01-18 ENCOUNTER — Emergency Department (HOSPITAL_COMMUNITY)
Admission: EM | Admit: 2015-01-18 | Discharge: 2015-01-18 | Disposition: A | Discharge status: Home / Self Care | Payer: Managed Care, Other (non HMO) | Attending: Emergency Medicine | Admitting: Emergency Medicine

## 2015-01-18 DIAGNOSIS — Z86718 Personal history of other venous thrombosis and embolism: Secondary | ICD-10-CM | POA: Insufficient documentation

## 2015-01-18 DIAGNOSIS — Z792 Long term (current) use of antibiotics: Secondary | ICD-10-CM | POA: Insufficient documentation

## 2015-01-18 DIAGNOSIS — Z3202 Encounter for pregnancy test, result negative: Secondary | ICD-10-CM | POA: Diagnosis not present

## 2015-01-18 DIAGNOSIS — N760 Acute vaginitis: Secondary | ICD-10-CM | POA: Diagnosis not present

## 2015-01-18 DIAGNOSIS — N390 Urinary tract infection, site not specified: Secondary | ICD-10-CM | POA: Diagnosis not present

## 2015-01-18 DIAGNOSIS — N898 Other specified noninflammatory disorders of vagina: Secondary | ICD-10-CM | POA: Diagnosis present

## 2015-01-18 DIAGNOSIS — B9689 Other specified bacterial agents as the cause of diseases classified elsewhere: Secondary | ICD-10-CM

## 2015-01-18 LAB — URINALYSIS, ROUTINE W REFLEX MICROSCOPIC
Bilirubin Urine: NEGATIVE
Glucose, UA: NEGATIVE mg/dL
KETONES UR: NEGATIVE mg/dL
Nitrite: NEGATIVE
Specific Gravity, Urine: 1.03 (ref 1.005–1.030)
Urobilinogen, UA: 0.2 mg/dL (ref 0.0–1.0)
pH: 6.5 (ref 5.0–8.0)

## 2015-01-18 LAB — URINE MICROSCOPIC-ADD ON

## 2015-01-18 LAB — WET PREP, GENITAL
Trich, Wet Prep: NONE SEEN
YEAST WET PREP: NONE SEEN

## 2015-01-18 LAB — POC URINE PREG, ED: Preg Test, Ur: NEGATIVE

## 2015-01-18 MED ORDER — CEPHALEXIN 500 MG PO CAPS: 500.0000 mg | ORAL_CAPSULE | Freq: Four times a day (QID) | ORAL | Status: DC

## 2015-01-18 MED ORDER — METRONIDAZOLE 500 MG PO TABS: 500.0000 mg | ORAL_TABLET | Freq: Two times a day (BID) | ORAL | Status: DC

## 2015-01-18 NOTE — ED Provider Notes (Signed)
CSN: [id number]     Arrival date & time 01/18/15  0912 History   First MD Initiated Contact with Patient 01/18/15 [phone]     No chief complaint on file.    (Consider location/radiation/quality/duration/timing/severity/associated sxs/prior Treatment) HPI   Brandy Barker is a 24 y.o. female who presents to the Emergency Department complaining of vaginal discharge with odor for one week.  Reports history of frequent bacterial vaginosis and states symptoms are similar to previous.  She also complains of urinary frequency and feeling like she is not completely emptying her bladder for 1-2 days.  She reports "pressure" sensation to her lower abdomen as well along with occasional pain ot her right lower back.  Voiding makes her symptoms worse.  She denies fever, chills, vaginal bleeding, or multiple sex partners.  She has Implanon to left arm for one year.    Past Medical History  Diagnosis Date  . DVT (deep venous thrombosis)    Past Surgical History  Procedure Laterality Date  . Cesearan    . Cesarean section     No family history on file. History  Substance Use Topics  . Smoking status: Never Smoker   . Smokeless tobacco: Not on file  . Alcohol Use: No   OB History    Gravida Para Term Preterm AB TAB SAB Ectopic Multiple Living   1              Review of Systems  Constitutional: Negative for fever, chills, activity change and appetite change.  Respiratory: Negative for chest tightness and shortness of breath.   Gastrointestinal: Negative for nausea, vomiting and abdominal pain.  Genitourinary: Positive for dysuria, urgency and frequency. Negative for hematuria, flank pain, decreased urine volume and difficulty urinating.  Musculoskeletal: Negative for back pain.  Skin: Negative for rash.  Neurological: Negative for dizziness, weakness and numbness.  Hematological: Negative for adenopathy.  Psychiatric/Behavioral: Negative for confusion.  All other systems reviewed and are  negative.     Allergies  Review of patient's allergies indicates no known allergies.  Home Medications   Prior to Admission medications   Medication Sig Start Date End Date Taking? Authorizing Provider  etonogestrel (NEXPLANON) 68 MG IMPL implant 1 each by Subdermal route once.    Historical Provider, MD  ibuprofen (ADVIL,MOTRIN) 600 MG tablet Take 1 tablet (600 mg total) by mouth every 6 (six) hours as needed for headache. 11/30/14   Burgess AmorJulie Idol, PA-C  metroNIDAZOLE (FLAGYL) 500 MG tablet Take 1 tablet (500 mg total) by mouth 2 (two) times daily. Patient not taking: Reported on 11/30/2014 08/03/14   Teressa LowerVrinda Pickering, NP  Multiple Vitamin (MULTIVITAMIN WITH MINERALS) TABS tablet Take 1 tablet by mouth daily.    Historical Provider, MD   There were no vitals taken for this visit. Physical Exam  Constitutional: She is oriented to person, place, and time. She appears well-developed and well-nourished. No distress.  HENT:  Head: Normocephalic and atraumatic.  Cardiovascular: Normal rate, regular rhythm, normal heart sounds and intact distal pulses.   No murmur heard. Pulmonary/Chest: Effort normal and breath sounds normal. No respiratory distress. She has no wheezes. She has no rales.  Abdominal: Soft. Normal appearance. She exhibits no distension and no mass. There is no hepatosplenomegaly. There is tenderness in the suprapubic area. There is no rigidity, no rebound, no guarding, no CVA tenderness and no tenderness at McBurney's point.  Mild ttp of the suprapubic region.  Remaining abdomen is soft, non-tender without guarding or rebound tenderness. No CVA  tenderness  Genitourinary: Uterus normal. There is no rash, tenderness or lesion on the right labia. There is no rash, tenderness or lesion on the left labia. Cervix exhibits no motion tenderness and no discharge. Right adnexum displays no mass and no tenderness. Left adnexum displays no mass and no tenderness. No tenderness or bleeding in the  vagina. No foreign body around the vagina. Vaginal discharge found.  Small amt of white vaginal discharge, odor present.  No CMT tenderness.   Musculoskeletal: Normal range of motion. She exhibits no edema.  Neurological: She is alert and oriented to person, place, and time. Coordination normal.  Skin: Skin is warm and dry. No rash noted.  Nursing note and vitals reviewed.   ED Course  Procedures (including critical care time) Labs Review Labs Reviewed  WET PREP, GENITAL - Abnormal; Notable for the following:    Clue Cells Wet Prep HPF POC MANY (*)    WBC, Wet Prep HPF POC MANY (*)    All other components within normal limits  URINALYSIS, ROUTINE W REFLEX MICROSCOPIC - Abnormal; Notable for the following:    APPearance CLOUDY (*)    Hgb urine dipstick LARGE (*)    Protein, ur >300 (*)    Leukocytes, UA TRACE (*)    All other components within normal limits  URINE MICROSCOPIC-ADD ON - Abnormal; Notable for the following:    Squamous Epithelial / LPF MANY (*)    Bacteria, UA MANY (*)    All other components within normal limits  URINE CULTURE  RPR  POC URINE PREG, ED  GC/CHLAMYDIA PROBE AMP (Gann)    Imaging Review No results found.   EKG Interpretation None     Urine, RPR,  GC and chlamydia cultures pending.     MDM   Final diagnoses:  UTI (urinary tract infection), uncomplicated  Bacterial vaginosis    Pt is well appearing, non-toxic.  No CVA tenderness, fever, vomiting to suggest pyelonephritis.  No concerning sx's for acute abdomen or TOA.  Pt agrees to tx with flagyl and keflex and advised that she will be contacted if cultures are positive.  She agrees to plan and verbalized understanding.      Pauline Ausammy Adaley Kiene, PA-C 01/18/15 1504  Blane OharaJoshua Zavitz, MD 01/21/15 [id number]  Blane OharaJoshua Zavitz, MD 01/21/15 1356

## 2015-01-18 NOTE — ED Notes (Signed)
Complain of a UTI and possible bacteria vaginosis

## 2015-01-18 NOTE — Discharge Instructions (Signed)
Bacterial Vaginosis °Bacterial vaginosis is an infection of the vagina. It happens when too many of certain germs (bacteria) grow in the vagina. °HOME CARE °· Take your medicine as told by your doctor. °· Finish your medicine even if you start to feel better. °· Do not have sex until you finish your medicine and are better. °· Tell your sex partner that you have an infection. They should see their doctor for treatment. °· Practice safe sex. Use condoms. Have only one sex partner. °GET HELP IF: °· You are not getting better after 3 days of treatment. °· You have more grey fluid (discharge) coming from your vagina than before. °· You have more pain than before. °· You have a fever. °MAKE SURE YOU:  °· Understand these instructions. °· Will watch your condition. °· Will get help right away if you are not doing well or get worse. °Document Released: 05/29/2008 Document Revised: 06/10/2013 Document Reviewed: 04/01/2013 °ExitCare® Patient Information ©2015 [employer]. This information is not intended to replace advice given to you by your health care provider. Make sure you discuss any questions you have with your health care provider. ° °Urinary Tract Infection °A urinary tract infection (UTI) can occur any place along the urinary tract. The tract includes the kidneys, ureters, bladder, and urethra. A type of germ called bacteria often causes a UTI. UTIs are often helped with antibiotic medicine.  °HOME CARE  °· If given, take antibiotics as told by your doctor. Finish them even if you start to feel better. °· Drink enough fluids to keep your pee (urine) clear or pale yellow. °· Avoid tea, drinks with caffeine, and bubbly (carbonated) drinks. °· Pee often. Avoid holding your pee in for a long time. °· Pee before and after having sex (intercourse). °· Wipe from front to back after you poop (bowel movement) if you are a woman. Use each tissue only once. °GET HELP RIGHT AWAY IF:  °· You have back pain. °· You have lower  belly (abdominal) pain. °· You have chills. °· You feel sick to your stomach (nauseous). °· You throw up (vomit). °· Your burning or discomfort with peeing does not go away. °· You have a fever. °· Your symptoms are not better in 3 days. °MAKE SURE YOU:  °· Understand these instructions. °· Will watch your condition. °· Will get help right away if you are not doing well or get worse. °Document Released: 02/06/2008 Document Revised: 05/14/2012 Document Reviewed: 03/20/2012 °ExitCare® Patient Information ©2015 [employer]. This information is not intended to replace advice given to you by your health care provider. Make sure you discuss any questions you have with your health care provider. ° °

## 2015-01-19 LAB — GC/CHLAMYDIA PROBE AMP (~~LOC~~) NOT AT ARMC
CHLAMYDIA, DNA PROBE: NEGATIVE
Neisseria Gonorrhea: NEGATIVE

## 2015-01-19 LAB — RPR: RPR: NONREACTIVE

## 2015-01-20 LAB — URINE CULTURE: Colony Count: >=100000

## 2015-01-21 ENCOUNTER — Telehealth (HOSPITAL_BASED_OUTPATIENT_CLINIC_OR_DEPARTMENT_OTHER): Payer: Self-pay | Admitting: Emergency Medicine

## 2015-01-21 NOTE — Telephone Encounter (Signed)
Post ED Visit - Positive Culture Follow-up  Culture report reviewed by antimicrobial stewardship pharmacist: []  Wes Dulaney, Pharm.D., BCPS [x]  Celedonio MiyamotoJeremy Frens, Pharm.D., BCPS []  Georgina PillionElizabeth Martin, Pharm.D., BCPS []  BystromMinh Pham, [address]Ph[address] BCPS, AAHIVP []  Estella HuskMichelle Turner, Pharm.D., BCPS, AAHIVP []  Elder CyphersLorie Poole, [address]Ph[address] BCPS  Positive urine culture E. Coli Treated with metronidazole/Cephalexin, organism sensitive to the same and no further patient follow-up is required at this time.  Berle MullMiller, Letticia Bhattacharyya 01/21/2015, 1:48 PM

## 2015-07-04 ENCOUNTER — Other Ambulatory Visit (HOSPITAL_COMMUNITY): Payer: Self-pay | Admitting: Emergency Medicine

## 2015-07-04 ENCOUNTER — Encounter (HOSPITAL_COMMUNITY): Payer: Self-pay | Admitting: Emergency Medicine

## 2015-07-04 ENCOUNTER — Emergency Department (HOSPITAL_COMMUNITY)
Admission: EM | Admit: 2015-07-04 | Discharge: 2015-07-04 | Disposition: A | Discharge status: Home / Self Care | Payer: Managed Care, Other (non HMO) | Attending: Emergency Medicine | Admitting: Emergency Medicine

## 2015-07-04 DIAGNOSIS — Z86718 Personal history of other venous thrombosis and embolism: Secondary | ICD-10-CM | POA: Diagnosis not present

## 2015-07-04 DIAGNOSIS — M79661 Pain in right lower leg: Secondary | ICD-10-CM | POA: Insufficient documentation

## 2015-07-04 DIAGNOSIS — M7989 Other specified soft tissue disorders: Secondary | ICD-10-CM

## 2015-07-04 LAB — BASIC METABOLIC PANEL
Anion gap: 3 — ABNORMAL LOW (ref 5–15)
BUN: 11 mg/dL (ref 6–20)
CHLORIDE: 110 mmol/L (ref 101–111)
CO2: 24 mmol/L (ref 22–32)
Calcium: 8.7 mg/dL — ABNORMAL LOW (ref 8.9–10.3)
Creatinine, Ser: 0.84 mg/dL (ref 0.44–1.00)
GFR calc Af Amer: >60 mL/min (ref >60)
GFR calc non Af Amer: >60 mL/min (ref >60)
GLUCOSE: 96 mg/dL (ref 65–99)
POTASSIUM: 3.8 mmol/L (ref 3.5–5.1)
Sodium: 137 mmol/L (ref 135–145)

## 2015-07-04 LAB — CBC WITH DIFFERENTIAL/PLATELET
Basophils Absolute: 0 10*3/uL (ref 0.0–0.1)
Basophils Relative: 0 %
EOS PCT: 5 %
Eosinophils Absolute: 0.3 10*3/uL (ref 0.0–0.7)
HCT: 37.6 % (ref 36.0–46.0)
Hemoglobin: 12.3 g/dL (ref 12.0–15.0)
LYMPHS PCT: 36 %
Lymphs Abs: 2.5 10*3/uL (ref 0.7–4.0)
MCH: 27.2 pg (ref 26.0–34.0)
MCHC: 32.7 g/dL (ref 30.0–36.0)
MCV: 83 fL (ref 78.0–100.0)
MONO ABS: 0.5 10*3/uL (ref 0.1–1.0)
Monocytes Relative: 8 %
Neutro Abs: 3.5 10*3/uL (ref 1.7–7.7)
Neutrophils Relative %: 51 %
PLATELETS: 281 10*3/uL (ref 150–400)
RBC: 4.53 MIL/uL (ref 3.87–5.11)
RDW: 14.4 % (ref 11.5–15.5)
WBC: 6.8 10*3/uL (ref 4.0–10.5)

## 2015-07-04 LAB — I-STAT BETA HCG BLOOD, ED (MC, WL, AP ONLY): I-stat hCG, quantitative: <5 m[IU]/mL (ref <5)

## 2015-07-04 MED ORDER — CLINDAMYCIN HCL 300 MG PO CAPS: 300.0000 mg | ORAL_CAPSULE | Freq: Three times a day (TID) | ORAL | Status: DC

## 2015-07-04 MED ORDER — ENOXAPARIN SODIUM 100 MG/ML ~~LOC~~ SOLN
1.0000 mg/kg | Freq: Once | SUBCUTANEOUS | Status: AC
  Administered 2015-07-04: 90 mg via SUBCUTANEOUS
  Filled 2015-07-04: qty 1

## 2015-07-04 NOTE — ED Notes (Signed)
History DVT to the right leg about 6 years ago.  Right leg is red and swelling noted.

## 2015-07-04 NOTE — ED Notes (Signed)
Pt w/ noted swelling to the right lower leg. Pt has a warm red area to the calf. No pain or redness noted to the back of calf

## 2015-07-04 NOTE — ED Notes (Signed)
Having pain to right leg for last 2 weeks, rates pain 8/10.  Denies injury to leg.

## 2015-07-04 NOTE — ED Provider Notes (Signed)
CSN: [id number]     Arrival date & time 07/04/15  1755 History   First MD Initiated Contact with Patient 07/04/15 1913     Chief Complaint  Patient presents with  . Leg Pain    right     (Consider location/radiation/quality/duration/timing/severity/associated sxs/prior Treatment) HPI Comments: 2 weeks of pain and swelling to right leg. Worsening tonight. Patient endorses a indurated area to the lateral calf over the past 2 days that is very tender. She has had a DVT in this leg about 6 years ago. She is no longer anticoagulated. No fever or vomiting. No chest pain or shortness of breath. No focal weakness, numbness or tingling. No bowel or bladder incontinence.  The history is provided by the patient.    Past Medical History  Diagnosis Date  . DVT (deep venous thrombosis) [facility])    Past Surgical History  Procedure Laterality Date  . Cesearan    . Cesarean section     History reviewed. No pertinent family history. Social History  Substance Use Topics  . Smoking status: Never Smoker   . Smokeless tobacco: None  . Alcohol Use: No   OB History    Gravida Para Term Preterm AB TAB SAB Ectopic Multiple Living   1              Review of Systems  Constitutional: Negative for fever, activity change and appetite change.  HENT: Negative for congestion and rhinorrhea.   Eyes: Negative for visual disturbance.  Respiratory: Negative for cough, chest tightness and shortness of breath.   Cardiovascular: Positive for leg swelling. Negative for chest pain.  Gastrointestinal: Negative for nausea, vomiting and abdominal pain.  Genitourinary: Negative for dysuria, hematuria, vaginal bleeding and vaginal discharge.  Musculoskeletal: Positive for myalgias and arthralgias. Negative for back pain and neck pain.  Skin: Negative for rash.  Neurological: Negative for dizziness, weakness, light-headedness and headaches.  A complete 10 system review of systems was obtained and all systems are  negative except as noted in the HPI and PMH.      Allergies  Review of patient's allergies indicates no known allergies.  Home Medications   Prior to Admission medications   Medication Sig Start Date End Date Taking? Authorizing Provider  etonogestrel (NEXPLANON) 68 MG IMPL implant 1 each by Subdermal route once.   Yes Historical Provider, MD  clindamycin (CLEOCIN) 300 MG capsule Take 1 capsule (300 mg total) by mouth 3 (three) times daily. 07/04/15   Glynn Octave, MD   BP 120/78 mmHg  Pulse 73  Temp(Src) 98.3 F (36.8 C) (Oral)  Resp 18  Ht  (1.6 m)  Wt 195 lb (88.451 kg)  BMI 34.55 kg/m2  SpO2 100%  LMP 06/27/2015 Physical Exam  Constitutional: She is oriented to person, place, and time. She appears well-developed and well-nourished. No distress.  HENT:  Head: Normocephalic and atraumatic.  Mouth/Throat: Oropharynx is clear and moist. No oropharyngeal exudate.  Eyes: Conjunctivae and EOM are normal. Pupils are equal, round, and reactive to light.  Neck: Normal range of motion. Neck supple.  No meningismus.  Cardiovascular: Normal rate, regular rhythm, normal heart sounds and intact distal pulses.   No murmur heard. Pulmonary/Chest: Effort normal and breath sounds normal. No respiratory distress.  Abdominal: Soft. There is no tenderness. There is no rebound and no guarding.  Musculoskeletal: Normal range of motion. She exhibits tenderness. She exhibits no edema.  Asymmetric swelling of right lower leg compared to left. Intact DP and PT pulses.  There is 4 cm area of induration to the right lateral lower leg. There is no fluctuance. There is minimal erythema.  Neurological: She is alert and oriented to person, place, and time. No cranial nerve deficit. She exhibits normal muscle tone. Coordination normal.  No ataxia on finger to nose bilaterally. No pronator drift. 5/5 strength throughout. CN 2-12 intact. Negative Romberg. Equal grip strength. Sensation intact. Gait is  normal.   Skin: Skin is warm.  Psychiatric: She has a normal mood and affect. Her behavior is normal.  Nursing note and vitals reviewed.   ED Course  Procedures (including critical care time) Labs Review Labs Reviewed  BASIC METABOLIC PANEL - Abnormal; Notable for the following:    Calcium 8.7 (*)    Anion gap 3 (*)    All other components within normal limits  CBC WITH DIFFERENTIAL/PLATELET  I-STAT BETA HCG BLOOD, ED ([facility], [facility], [facility] ONLY)    Imaging Review No results found. I have personally reviewed and evaluated these images and lab results as part of my medical decision-making.   EKG Interpretation None      MDM   Final diagnoses:  Leg swelling   Leg pain and swelling. No fever or vomiting. Concern for DVT. Ultrasound not available. No chest pain or shortness of breath to suggest pulmonary embolism.  Kidney function is normal. Patient is not pregnant. Ultrasound is not available. She is given a dose of Lovenox to cover her for possible DVT.  Will schedule ultrasound for the morning. Patient will also be prescribed antibiotics for possible early cellulitis along her right lateral leg. Return precautions discussed.    Glynn OctaveStephen Estel Scholze, MD 07/05/15 [phone]

## 2015-07-04 NOTE — Discharge Instructions (Signed)
Edema Return to tomorrow for an ultrasound of your leg. Take antibiotic as prescribed. Return to the ED if you develop new or worsening symptoms including chest pain or shortness of breath. Edema is an abnormal buildup of fluids in your bodytissues. Edema is somewhatdependent on gravity to pull the fluid to the lowest place in your body. That makes the condition more common in the legs and thighs (lower extremities). Painless swelling of the feet and ankles is common and becomes more likely as you get older. It is also common in looser tissues, like around your eyes.  When the affected area is squeezed, the fluid may move out of that spot and leave a dent for a few moments. This dent is called pitting.  CAUSES  There are many possible causes of edema. Eating too much salt and being on your feet or sitting for a long time can cause edema in your legs and ankles. Hot weather may make edema worse. Common medical causes of edema include:  Heart failure.  Liver disease.  Kidney disease.  Weak blood vessels in your legs.  Cancer.  An injury.  Pregnancy.  Some medications.  Obesity. SYMPTOMS  Edema is usually painless.Your skin may look swollen or shiny.  DIAGNOSIS  Your health care provider may be able to diagnose edema by asking about your medical history and doing a physical exam. You may need to have tests such as X-rays, an electrocardiogram, or blood tests to check for medical conditions that may cause edema.  TREATMENT  Edema treatment depends on the cause. If you have heart, liver, or kidney disease, you need the treatment appropriate for these conditions. General treatment may include:  Elevation of the affected body part above the level of your heart.  Compression of the affected body part. Pressure from elastic bandages or support stockings squeezes the tissues and forces fluid back into the blood vessels. This keeps fluid from entering the tissues.  Restriction of fluid  and salt intake.  Use of a water pill (diuretic). These medications are appropriate only for some types of edema. They pull fluid out of your body and make you urinate more often. This gets rid of fluid and reduces swelling, but diuretics can have side effects. Only use diuretics as directed by your health care provider. HOME CARE INSTRUCTIONS   Keep the affected body part above the level of your heart when you are lying down.   Do not sit still or stand for prolonged periods.   Do not put anything directly under your knees when lying down.  Do not wear constricting clothing or garters on your upper legs.   Exercise your legs to work the fluid back into your blood vessels. This may help the swelling go down.   Wear elastic bandages or support stockings to reduce ankle swelling as directed by your health care provider.   Eat a low-salt diet to reduce fluid if your health care provider recommends it.   Only take medicines as directed by your health care provider. SEEK MEDICAL CARE IF:   Your edema is not responding to treatment.  You have heart, liver, or kidney disease and notice symptoms of edema.  You have edema in your legs that does not improve after elevating them.   You have sudden and unexplained weight gain. SEEK IMMEDIATE MEDICAL CARE IF:   You develop shortness of breath or chest pain.   You cannot breathe when you lie down.  You develop pain, redness, or warmth  in the swollen areas.   You have heart, liver, or kidney disease and suddenly get edema.  You have a fever and your symptoms suddenly get worse. MAKE SURE YOU:   Understand these instructions.  Will watch your condition.  Will get help right away if you are not doing well or get worse.   This information is not intended to replace advice given to you by your health care provider. Make sure you discuss any questions you have with your health care provider.   Document Released: 08/20/2005  Document Revised: 09/10/2014 Document Reviewed: 06/12/2013 Elsevier Interactive Patient Education [employer]2016 Elsevier Inc.

## 2015-07-05 ENCOUNTER — Ambulatory Visit (HOSPITAL_COMMUNITY)
Admission: RE | Admit: 2015-07-05 | Discharge: 2015-07-05 | Disposition: A | Discharge status: Home / Self Care | Payer: Managed Care, Other (non HMO) | Source: Ambulatory Visit | Attending: Emergency Medicine | Admitting: Emergency Medicine

## 2015-07-05 DIAGNOSIS — M79661 Pain in right lower leg: Secondary | ICD-10-CM | POA: Insufficient documentation

## 2015-07-05 DIAGNOSIS — I82411 Acute embolism and thrombosis of right femoral vein: Secondary | ICD-10-CM | POA: Insufficient documentation

## 2015-07-05 DIAGNOSIS — M7989 Other specified soft tissue disorders: Secondary | ICD-10-CM

## 2015-07-05 DIAGNOSIS — I82441 Acute embolism and thrombosis of right tibial vein: Secondary | ICD-10-CM | POA: Insufficient documentation

## 2015-07-05 MED ORDER — XARELTO VTE STARTER PACK 15 & 20 MG PO TBPK: 15.0000 mg | ORAL_TABLET | ORAL | Status: DC

## 2015-07-05 NOTE — ED Provider Notes (Signed)
[place] Venous Img Lower Unilateral Right  07/05/2015  CLINICAL DATA:  Right lower calf pain for 2 weeks. EXAM: Right LOWER EXTREMITY VENOUS DOPPLER ULTRASOUND TECHNIQUE: Gray-scale sonography with graded compression, as well as color Doppler and duplex ultrasound were performed to evaluate the lower extremity deep venous systems from the level of the common femoral vein and including the common femoral, femoral, profunda femoral, popliteal and calf veins including the posterior tibial, peroneal and gastrocnemius veins when visible. The superficial great saphenous vein was also interrogated. Spectral Doppler was utilized to evaluate flow at rest and with distal augmentation maneuvers in the common femoral, femoral and popliteal veins. COMPARISON:  None. FINDINGS: Contralateral Common Femoral Vein: Respiratory phasicity is normal and symmetric with the symptomatic side. No evidence of thrombus. Normal compressibility. Common Femoral Vein: Thrombus is noted with partial compressibility and phasicity consistent with nonocclusive thrombus. Saphenofemoral Junction: Nonocclusive thrombus is noted. Profunda Femoral Vein: Nonocclusive thrombus is noted. Femoral Vein: Nonocclusive thrombus is noted. Popliteal Vein: Nonocclusive thrombus is noted. Calf Veins: Nonocclusive thrombus is noted in posterior tibial vein. Peroneal vein is not well visualized. Superficial Great Saphenous Vein: No evidence of thrombus. Normal compressibility and flow on color Doppler imaging. Venous Reflux:  None. Other Findings:  None. IMPRESSION: Acute nonocclusive deep venous thrombosis is seen in the right lower extremity extending from right common femoral vein to right posterior tibial vein. Electronically Signed   By: Lupita RaiderJames  Green Jr, M.D.   On: 07/05/2015 14:45     Patient returns for DVT ultrasound. Acute nonocclusive DVT seen in the right lower extremity from the right common femoral vein to the right posterior tibial vein. Patient continues  to have no chest symptoms. Similar to when she had a DVT 6 years ago. At that time she was on Lovenox and Coumadin. Her pain is well controlled. She will be given compression hose and started on Xarelto after discussion of risks and benefits of this medication including bleeding. Patient was given recommendations on different primary care providers in this area and advised to return to the ER if any symptoms worsen or change.  Pricilla LovelessScott Dresean Beckel, MD 07/05/15 [phone]

## 2015-08-15 ENCOUNTER — Emergency Department (HOSPITAL_COMMUNITY)
Admission: EM | Admit: 2015-08-15 | Discharge: 2015-08-15 | Discharge status: Against Medical Advice | Payer: Managed Care, Other (non HMO) | Attending: Emergency Medicine | Admitting: Emergency Medicine

## 2015-08-15 ENCOUNTER — Encounter (HOSPITAL_COMMUNITY): Payer: Self-pay | Admitting: Emergency Medicine

## 2015-08-15 DIAGNOSIS — M79661 Pain in right lower leg: Secondary | ICD-10-CM | POA: Insufficient documentation

## 2015-08-15 NOTE — ED Notes (Signed)
Pt left, no answer in waiting room,

## 2015-08-15 NOTE — ED Notes (Signed)
Here a few weeks ago for DVT to  pain right leg, rates pain 8/10, currently.  Pain is to right lower leg, pt has on compression stocking to right leg.

## 2015-08-15 NOTE — ED Notes (Signed)
Pt says she did not take her Xarelto during end of triage.  Last dose was December 4th.  Pt says she has not been able to afford the medication.

## 2015-10-27 DIAGNOSIS — I87009 Postthrombotic syndrome without complications of unspecified extremity: Secondary | ICD-10-CM | POA: Insufficient documentation

## 2016-05-01 ENCOUNTER — Other Ambulatory Visit: Payer: Self-pay | Admitting: Advanced Practice Midwife

## 2016-05-07 ENCOUNTER — Encounter (HOSPITAL_COMMUNITY): Payer: Self-pay | Admitting: Emergency Medicine

## 2016-05-07 ENCOUNTER — Emergency Department (HOSPITAL_COMMUNITY)
Admission: EM | Admit: 2016-05-07 | Discharge: 2016-05-07 | Disposition: A | Discharge status: Home / Self Care | Payer: Managed Care, Other (non HMO) | Attending: Emergency Medicine | Admitting: Emergency Medicine

## 2016-05-07 DIAGNOSIS — J189 Pneumonia, unspecified organism: Secondary | ICD-10-CM

## 2016-05-07 DIAGNOSIS — R05 Cough: Secondary | ICD-10-CM | POA: Diagnosis present

## 2016-05-07 MED ORDER — BENZONATATE 100 MG PO CAPS: 100.0000 mg | ORAL_CAPSULE | Freq: Three times a day (TID) | ORAL | 0 refills | Status: DC

## 2016-05-07 MED ORDER — AZITHROMYCIN 250 MG PO TABS: 250.0000 mg | ORAL_TABLET | Freq: Every day | ORAL | 0 refills | Status: DC

## 2016-05-07 NOTE — ED Provider Notes (Signed)
[facility]-EMERGENCY DEPT Provider Note   CSN: [id number] Arrival date & time: 05/07/16  0726     History   Chief Complaint Chief Complaint  Patient presents with  . Cough    HPI Brandy Barker is a 25 y.o. female.  The patient is a 25 year old female, she does have a history of pulmonary embolism and is currently taking Xarelto, otherwise she is in good health. Approximately one month ago she developed upper respiratory symptoms like a cold as well as some coughing, this did get slightly better but then came back and now is feeling like she is having subjective fevers as well. The cough is producing purulent sputum, there is occasional small amounts of blood-streaked sputum but not every time. She denies any chest pain at all, she has no shortness of breath, she has no swelling in her legs. She does have a generalized fatigue that goes along with her coughing.    Cough     Past Medical History:  Diagnosis Date  . DVT (deep venous thrombosis) ([facility])     There are no active problems to display for this patient.   Past Surgical History:  Procedure Laterality Date  . CESAREAN SECTION    . cesearan      OB History    Gravida Para Term Preterm AB Living   1             SAB TAB Ectopic Multiple Live Births                   Home Medications    Prior to Admission medications   Medication Sig Start Date End Date Taking? Authorizing Provider  azithromycin (ZITHROMAX Z-PAK) 250 MG tablet Take 1 tablet (250 mg total) by mouth daily. 500mg  PO day 1, then 250mg  PO days 205 05/07/16   Eber HongBrian Sybil Shrader, MD  benzonatate (TESSALON) 100 MG capsule Take 1 capsule (100 mg total) by mouth every 8 (eight) hours. 05/07/16   Eber HongBrian Orley Lawry, MD  clindamycin (CLEOCIN) 300 MG capsule Take 1 capsule (300 mg total) by mouth 3 (three) times daily. 07/04/15   Glynn OctaveStephen Rancour, MD  etonogestrel (NEXPLANON) 68 MG IMPL implant 1 each by Subdermal route once.    Historical Provider, MD  XARELTO STARTER PACK 15 & 20  MG TBPK Take 15-20 mg by mouth as directed. Take as directed on package: Start with one 15mg  tablet by mouth twice a day with food. On Day 22, switch to one 20mg  tablet once a day with food. 07/05/15   Pricilla LovelessScott Goldston, MD    Family History History reviewed. No pertinent family history.  Social History Social History  Substance Use Topics  . Smoking status: Never Smoker  . Smokeless tobacco: Not on file  . Alcohol use No     Allergies   Review of patient's allergies indicates no known allergies.   Review of Systems Review of Systems  Respiratory: Positive for cough.   All other systems reviewed and are negative.    Physical Exam Updated Vital Signs BP 118/77 (BP Location: Left Arm)   Pulse 77   Temp 97.9 F (36.6 C) (Oral)   Resp 18   Ht 5\' 4"  (1.626 m)   Wt 200 lb (90.7 kg)   LMP 04/30/2016   SpO2 98%   BMI 34.33 kg/m   Physical Exam  Constitutional: She appears well-developed and well-nourished. No distress.  HENT:  Head: Normocephalic and atraumatic.  Mouth/Throat: Oropharynx is clear and moist. No oropharyngeal exudate.  Eyes: Conjunctivae and EOM are normal. Pupils are equal, round, and reactive to light. Right eye exhibits no discharge. Left eye exhibits no discharge. No scleral icterus.  Neck: Normal range of motion. Neck supple. No JVD present. No thyromegaly present.  Cardiovascular: Normal rate, regular rhythm, normal heart sounds and intact distal pulses.  Exam reveals no gallop and no friction rub.   No murmur heard. Pulmonary/Chest: Effort normal and breath sounds normal. No respiratory distress. She has no wheezes. She has no rales.  Abdominal: Soft. Bowel sounds are normal. She exhibits no distension and no mass. There is no tenderness.  Musculoskeletal: Normal range of motion. She exhibits no edema or tenderness.  Lymphadenopathy:    She has no cervical adenopathy.  Neurological: She is alert. Coordination normal.  Skin: Skin is warm and dry. No  rash noted. No erythema.  Psychiatric: She has a normal mood and affect. Her behavior is normal.  Nursing note and vitals reviewed.    ED Treatments / Results  Labs (all labs ordered are listed, but only abnormal results are displayed) Labs Reviewed - No data to display  EKG  EKG Interpretation None       Radiology No results found.  Procedures Procedures (including critical care time)  Medications Ordered in ED Medications - No data to display   Initial Impression / Assessment and Plan / ED Course  I have reviewed the triage vital signs and the nursing notes.  Pertinent labs & imaging results that were available during my care of the patient were reviewed by me and considered in my medical decision making (see chart for details).  Clinical Course   Lungs are clear, vital signs unremarkable, the patient appears clinically stable, possibly has a walking pneumonia, would also consider bronchitis, attempt cough medication and Zithromax, patient is in agreement, at this time no further workup including no chest x-ray or labs are indicated. I do not think that the small amount of blood that she has from her Xarelto is related to a larger pulmonary embolism given no tachycardia hypoxia or chest pain or shortness of breath  Final Clinical Impressions(s) / ED Diagnoses   Final diagnoses:  Walking pneumonia    New Prescriptions New Prescriptions   AZITHROMYCIN (ZITHROMAX Z-PAK) 250 MG TABLET    Take 1 tablet (250 mg total) by mouth daily. 500mg  PO day 1, then 250mg  PO days 205   BENZONATATE (TESSALON) 100 MG CAPSULE    Take 1 capsule (100 mg total) by mouth every 8 (eight) hours.     Eber Hong, MD 05/07/16 [phone]

## 2016-05-07 NOTE — Discharge Instructions (Signed)

## 2016-05-07 NOTE — ED Notes (Signed)
Patient with no complaints at this time. Respirations even and unlabored. Skin warm/dry. Discharge instructions reviewed with patient at this time. Patient given opportunity to voice concerns/ask questions. Patient discharged at this time and left Emergency Department with steady gait.   

## 2016-05-07 NOTE — ED Triage Notes (Signed)
Reports productive cough, congestion, fever, chills, and generally not feeling well since last week.  NO meds taken for fever this morning.

## 2016-05-14 ENCOUNTER — Other Ambulatory Visit: Payer: Self-pay | Admitting: Adult Health

## 2016-06-25 ENCOUNTER — Other Ambulatory Visit (HOSPITAL_COMMUNITY)
Admission: RE | Admit: 2016-06-25 | Discharge: 2016-06-25 | Disposition: A | Discharge status: Home / Self Care | Payer: Managed Care, Other (non HMO) | Source: Ambulatory Visit | Attending: Adult Health | Admitting: Adult Health

## 2016-06-25 ENCOUNTER — Ambulatory Visit (INDEPENDENT_AMBULATORY_CARE_PROVIDER_SITE_OTHER): Payer: Managed Care, Other (non HMO) | Admitting: Adult Health

## 2016-06-25 ENCOUNTER — Encounter: Payer: Self-pay | Admitting: Adult Health

## 2016-06-25 VITALS — BP 102/70 | HR 66 | Ht 64.0 in | Wt 200.0 lb

## 2016-06-25 DIAGNOSIS — Z01411 Encounter for gynecological examination (general) (routine) with abnormal findings: Secondary | ICD-10-CM

## 2016-06-25 DIAGNOSIS — N760 Acute vaginitis: Secondary | ICD-10-CM | POA: Diagnosis not present

## 2016-06-25 DIAGNOSIS — Z975 Presence of (intrauterine) contraceptive device: Secondary | ICD-10-CM | POA: Insufficient documentation

## 2016-06-25 DIAGNOSIS — Z01419 Encounter for gynecological examination (general) (routine) without abnormal findings: Secondary | ICD-10-CM | POA: Diagnosis not present

## 2016-06-25 DIAGNOSIS — B9689 Other specified bacterial agents as the cause of diseases classified elsewhere: Secondary | ICD-10-CM

## 2016-06-25 DIAGNOSIS — N898 Other specified noninflammatory disorders of vagina: Secondary | ICD-10-CM

## 2016-06-25 DIAGNOSIS — Z113 Encounter for screening for infections with a predominantly sexual mode of transmission: Secondary | ICD-10-CM | POA: Insufficient documentation

## 2016-06-25 DIAGNOSIS — Z86718 Personal history of other venous thrombosis and embolism: Secondary | ICD-10-CM

## 2016-06-25 MED ORDER — METRONIDAZOLE 500 MG PO TABS: 500.0000 mg | ORAL_TABLET | Freq: Two times a day (BID) | ORAL | 0 refills | Status: DC

## 2016-06-25 NOTE — Progress Notes (Signed)
Patient ID: Brandy Barker, female   DOB: 08/28/1991, 25 y.o.   MRN: [id number] History of Present Illness:  Brandy Barker is a 25 year old black female, new to this practice, in for well woman gyn exam and pap.She has had 3  C section and had DVT 2-3 weeks after first section behind right knee, and then recently had DVT in right calf, is on Xarelto now.She has nexplanon in left arm since 12/08/13.And she says she has low protein S.  She is Merchandiser, retailsupervisor at [employer].   Current Medications, Allergies, Past Medical History, Past Surgical History, Family History and Social History were reviewed in [facility] electronic [facility] record.     Review of Systems: Patient denies any headaches, hearing loss, fatigue, blurred vision, shortness of breath, chest pain, abdominal pain, problems with bowel movements, urination, or intercourse. No joint pain or mood swings.+vaginal discharge on and off    Physical Exam:BP 102/70 (BP Location: Left Arm, Patient Position: Sitting, Cuff Size: Normal)   Pulse 66   Ht 5\' 4"  (1.626 m)   Wt 200 lb (90.7 kg)   BMI 34.33 kg/m  General:  Well developed, well nourished, no acute distress Skin:  Warm and dry Neck:  Midline trachea, normal thyroid, good ROM, no lymphadenopathy Lungs; Clear to auscultation bilaterally Breast:  No dominant palpable mass, retraction, or nipple discharge.left nipple inverted since was breast feeding second child  Cardiovascular: Regular rate and rhythm Abdomen:  Soft, non tender, no hepatosplenomegaly Pelvic:  External genitalia is normal in appearance, no lesions.  The vagina is normal in appearance,has creamy white discharge with odor. Urethra has no lesions or masses. The cervix is smooth, pap with GC/CHLperformed.  Uterus is felt to be normal size, shape, and contour.  No adnexal masses or tenderness noted.Bladder is non tender, no masses felt. Wet prep:+WBC and Clue cells Extremities/musculoskeletal:  No swelling or varicosities noted, no  clubbing or cyanosis,has compression hose on right lower leg,nexplanon easily palpated left arm.  Psych:  No mood changes, alert and cooperative,seems happy PHQ 2 score 0. Discussed nexplanon and DVT with Dr Despina HiddenEure, and options for replacement next year would be mirena, another nexplanon, para gard or tubal.  Impression: 1. Encounter for gynecological examination with Papanicolaou smear of cervix   2. History of DVT of lower extremity   3. Nexplanon in place   4. Vaginal discharge   5. BV (bacterial vaginosis)       Plan: Rx flagyl 500 mg 1 bid x 7 days, no alcohol, review handout on BV   Physical in 1 year,pap in 3 if normal Call before April with decision for nexplanon or IUD Mammogram at 5040 and yearly

## 2016-06-25 NOTE — Patient Instructions (Addendum)
Physical in 1 year, pap in 3 if normal Call before April with decision for nexplanon or IUD Mammogram yearly at 38 No alcohol  Bacterial Vaginosis Bacterial vaginosis is a vaginal infection that occurs when the normal balance of bacteria in the vagina is disrupted. It results from an overgrowth of certain bacteria. This is the most common vaginal infection in women of childbearing age. Treatment is important to prevent complications, especially in pregnant women, as it can cause a premature delivery. CAUSES  Bacterial vaginosis is caused by an increase in harmful bacteria that are normally present in smaller amounts in the vagina. Several different kinds of bacteria can cause bacterial vaginosis. However, the reason that the condition develops is not fully understood. RISK FACTORS Certain activities or behaviors can put you at an increased risk of developing bacterial vaginosis, including:  Having a new sex partner or multiple sex partners.  Douching.  Using an intrauterine device (IUD) for contraception. Women do not get bacterial vaginosis from toilet seats, bedding, swimming pools, or contact with objects around them. SIGNS AND SYMPTOMS  Some women with bacterial vaginosis have no signs or symptoms. Common symptoms include:  Grey vaginal discharge.  A fishlike odor with discharge, especially after sexual intercourse.  Itching or burning of the vagina and vulva.  Burning or pain with urination. DIAGNOSIS  Your health care provider will take a medical history and examine the vagina for signs of bacterial vaginosis. A sample of vaginal fluid may be taken. Your health care provider will look at this sample under a microscope to check for bacteria and abnormal cells. A vaginal pH test may also be done.  TREATMENT  Bacterial vaginosis may be treated with antibiotic medicines. These may be given in the form of a pill or a vaginal cream. A second round of antibiotics may be prescribed if the  condition comes back after treatment. Because bacterial vaginosis increases your risk for sexually transmitted diseases, getting treated can help reduce your risk for chlamydia, gonorrhea, HIV, and herpes. HOME CARE INSTRUCTIONS   Only take over-the-counter or prescription medicines as directed by your health care provider.  If antibiotic medicine was prescribed, take it as directed. Make sure you finish it even if you start to feel better.  Tell all sexual partners that you have a vaginal infection. They should see their health care provider and be treated if they have problems, such as a mild rash or itching.  During treatment, it is important that you follow these instructions:  Avoid sexual activity or use condoms correctly.  Do not douche.  Avoid alcohol as directed by your health care provider.  Avoid breastfeeding as directed by your health care provider. SEEK MEDICAL CARE IF:   Your symptoms are not improving after 3 days of treatment.  You have increased discharge or pain.  You have a fever. MAKE SURE YOU:   Understand these instructions.  Will watch your condition.  Will get help right away if you are not doing well or get worse. FOR MORE INFORMATION  Centers for Disease Control and Prevention, Division of STD Prevention: SolutionApps.co.zawww.cdc.gov/std American Sexual Health Association (ASHA): www.ashastd.org    This information is not intended to replace advice given to you by your health care provider. Make sure you discuss any questions you have with your health care provider.   Document Released: 08/20/2005 Document Revised: 09/10/2014 Document Reviewed: 04/01/2013 Elsevier Interactive Patient Education [employer]2016 Elsevier Inc.

## 2016-06-26 LAB — CYTOLOGY - PAP
ADEQUACY: ABSENT
Chlamydia: NEGATIVE
DIAGNOSIS: NEGATIVE
Neisseria Gonorrhea: NEGATIVE

## 2016-12-12 ENCOUNTER — Ambulatory Visit (INDEPENDENT_AMBULATORY_CARE_PROVIDER_SITE_OTHER): Payer: Managed Care, Other (non HMO) | Admitting: Advanced Practice Midwife

## 2016-12-12 ENCOUNTER — Encounter: Payer: Self-pay | Admitting: Advanced Practice Midwife

## 2016-12-12 VITALS — BP 112/62 | HR 83 | Wt 202.0 lb

## 2016-12-12 DIAGNOSIS — Z3046 Encounter for surveillance of implantable subdermal contraceptive: Secondary | ICD-10-CM

## 2016-12-12 NOTE — Progress Notes (Signed)
HPI:  Brandy Barker 26 y.o. here for Nexplanon removal.  Her future plans for birth control are abstinence.  Past Medical History: Past Medical History:  Diagnosis Date  . DVT (deep venous thrombosis) (HCC)     Past Surgical History: Past Surgical History:  Procedure Laterality Date  . CESAREAN SECTION    . cesearan      Family History: Family History  Problem Relation Age of Onset  . Cancer Paternal Grandfather   . Diabetes Paternal Grandmother   . Cancer Paternal Grandmother   . Hypertension Father   . Other Sister     DVT    Social History: Social History  Substance Use Topics  . Smoking status: Never Smoker  . Smokeless tobacco: Never Used  . Alcohol use No    Allergies: No Known Allergies  Meds:  (Not in a hospital admission)    Patient given informed consent for removal of her Nexplanon, time out was performed.  Signed copy in the chart.  Appropriate time out taken. Implanon site identified.  Area prepped in usual sterile fashon. One cc of 1% lidocaine was used to anesthetize the area at the distal end of the implant. A small stab incision was made right beside the implant on the distal portion.  The Nexplanon rod was grasped using hemostats and removed without difficulty.  There was less than 3 cc blood loss. There were no complications.  A small amount of antibiotic ointment and steri-strips were applied over the small incision.  A pressure bandage was applied to reduce any bruising.  The patient tolerated the procedure well and was given post procedure instructions.

## 2017-02-07 ENCOUNTER — Emergency Department (HOSPITAL_COMMUNITY)
Admission: EM | Admit: 2017-02-07 | Discharge: 2017-02-07 | Disposition: A | Discharge status: Home / Self Care | Payer: Managed Care, Other (non HMO) | Attending: Emergency Medicine | Admitting: Emergency Medicine

## 2017-02-07 ENCOUNTER — Encounter (HOSPITAL_COMMUNITY): Payer: Self-pay | Admitting: Emergency Medicine

## 2017-02-07 DIAGNOSIS — M79604 Pain in right leg: Secondary | ICD-10-CM

## 2017-02-07 DIAGNOSIS — M7989 Other specified soft tissue disorders: Secondary | ICD-10-CM | POA: Insufficient documentation

## 2017-02-07 MED ORDER — ENOXAPARIN SODIUM 100 MG/ML ~~LOC~~ SOLN
1.0000 mg/kg | Freq: Once | SUBCUTANEOUS | Status: AC
  Administered 2017-02-07: 85 mg via SUBCUTANEOUS
  Filled 2017-02-07: qty 1

## 2017-02-07 NOTE — ED Triage Notes (Signed)
Pt c/o lower right leg swelling for over a week with pain. Pt has history of dvt.

## 2017-02-07 NOTE — Discharge Instructions (Addendum)
Return tomorrow morning as scheduled for the ultrasound of the right lower leg, to be evaluated for blood clot.  To the imaging, come to the emergency department for discussion of the results and treatment plans.   Keep the right leg elevated and use heat on the sore area 3 or 4 times a day, until you return.   You may take either the Lovenox and the Coumadin OR the Xarelto  If you choose Coumadin then you need to take Lovenox shots twice a day while you are taking the Coumadin and have your blood  work retested within 1 week to make sure that the Coumadin level is appropriate.  If you choose to take Xarelto then do not take the Lovenox or the Coumadin. Please see the list of family doctors below to follow-up  Return to the emergency department for severe or worsening symptoms.  [facility]    Kari BaarsEdward Hawkins MD. Specialty: Pulmonary Disease Contact information: [address]  [address]  [place] [address]  [phone]   Syliva OvermanMargaret Simpson, MD. Specialty: [facility] Contact information: [address]  [place] [address]  [phone]   Lilyan PuntScott Luking, MD. Specialty: [facility] Contact information: [address]  [address]  [place] [address]  [phone]   Avon Gullyesfaye Fanta, MD Specialty: Internal Medicine Contact information: [address]  [place] [address]  [phone]   Catalina PizzaZach Hall, MD. Specialty: Internal Medicine Contact information: [address]  [place] [address]  [phone]   Butch PennyAngus Mcinnis, MD. Specialty: Family Medicine Contact information: [address]  [place] [address]  [phone]   John GiovanniStephen Knowlton, MD. Specialty: [facility] Contact information: [address]  [address]  [place] [address]  [phone]   Carylon Perchesoy Fagan, MD. Specialty: Internal Medicine Contact information: [address]  [address]  [place] [address]  [phone]

## 2017-02-07 NOTE — ED Provider Notes (Signed)
[facility]-EMER[facility]Y DEPT Provider Note   CSN: [id number] Arrival date & time: 02/07/17  1845     History   Chief Complaint Chief Complaint  Patient presents with  . Leg Swelling    HPI Brandy Barker is a 26 y.o. female.  She complains of swelling and discoloration in her right lower leg, for several days, with worsening chronic intermittent swelling of her right lower leg.  She stopped taking Xarelto 2 or 3 weeks ago because it was "too expensive."  She has had several DVTs in the last 8 years, and being on anticoagulants, intermittently.  She denies current shortness of breath, fever, chills, cough, nausea or vomiting.  She is currently employed.  She was getting primary care in [facility], and has an appointment with her PCP there tomorrow morning.  She decided to come here tonight because she was uncomfortable.  There are no other known modifying factors.  HPI  Past Medical History:  Diagnosis Date  . DVT (deep venous thrombosis) [facility])     Patient Active Problem List   Diagnosis Date Noted  . History of DVT of lower extremity 06/25/2016  . Vaginal discharge 06/25/2016  . BV (bacterial vaginosis) 06/25/2016  . Encounter for gynecological examination with Papanicolaou smear of cervix 06/25/2016    Past Surgical History:  Procedure Laterality Date  . CESAREAN SECTION    . cesearan      OB History    Gravida Para Term Preterm AB Living   5 3 3   2 3    SAB TAB Ectopic Multiple Live Births   2       3       Home Medications    Prior to Admission medications   Medication Sig Start Date End Date Taking? Authorizing Provider  etonogestrel (NEXPLANON) 68 MG IMPL implant 1 each by Subdermal route once.    [provider]  XARELTO STARTER PACK 15 & 20 MG TBPK Take 15-20 mg by mouth as directed. Take as directed on package: Start with one 15mg  tablet by mouth twice a day with food. On Day 22, switch to one 20mg  tablet once a day with food. 07/05/15    [name], MD    Family History Family History  Problem Relation Age of Onset  . Cancer Paternal Grandfather   . Diabetes Paternal Grandmother   . Cancer Paternal Grandmother   . Hypertension Father   . Other Sister        DVT    Social History Social History  Substance Use Topics  . Smoking status: Never Smoker  . Smokeless tobacco: Never Used  . Alcohol use No     Allergies   Patient has no known allergies.   Review of Systems Review of Systems  All other systems reviewed and are negative.    Physical Exam Updated Vital Signs BP 114/78   Pulse 86   Temp 98.3 F (36.8 C)   Resp 18   Ht 5\' 4"  (1.626 m)   Wt 85.3 kg (188 lb)   LMP 02/05/2017   SpO2 99%   BMI 32.27 kg/m   Physical Exam  Constitutional: She is oriented to person, place, and time. She appears well-developed and well-nourished. No distress.  HENT:  Head: Normocephalic and atraumatic.  Eyes: Conjunctivae and EOM are normal. Pupils are equal, round, and reactive to light.  Neck: Normal range of motion and phonation normal. Neck supple.  Cardiovascular: Normal rate and regular rhythm.   Pulmonary/Chest: Effort  normal and breath sounds normal. She exhibits no tenderness.  Musculoskeletal: Normal range of motion.  Right lower leg swollen, as compared to the left.  Swelling goes from the knee to foot.  The discoloration, chronic appearing of the right lower leg, both lateral and medial above the ankle.  No calf tenderness, or popliteal mass.  Neurological: She is alert and oriented to person, place, and time. She exhibits normal muscle tone.  Skin: Skin is warm and dry.  Psychiatric: She has a normal mood and affect. Her behavior is normal. Judgment and thought content normal.  Nursing note and vitals reviewed.    ED Treatments / Results  Labs (all labs ordered are listed, but only abnormal results are displayed) Labs Reviewed - No data to display  EKG  EKG Interpretation None        Radiology No results found.  Procedures Procedures (including critical care time)  Medications Ordered in ED Medications  enoxaparin (LOVENOX) injection 85 mg (not administered)     Initial Impression / Assessment and Plan / ED Course  I have reviewed the triage vital signs and the nursing notes.  Pertinent labs & imaging results that were available during my care of the patient were reviewed by me and considered in my medical decision making (see chart for details).  Clinical Course as of Feb 07 1917  Thu Feb 07, 2017  1913 Clinical concern for worsening right leg swelling, with DVT being a cause since she has stopped taking her Xarelto.  Patient will be empirically treated, with Lovenox, and scheduled for outpatient ultrasound imaging tomorrow morning.  [EW]    Clinical Course User Index [EW] Mancel BaleWentz, Donnica Jarnagin, MD    Patient Vitals for the past 24 hrs:  BP Temp Pulse Resp SpO2 Height Weight  02/07/17 1857 114/78 98.3 F (36.8 C) 86 18 99 % 5\' 4"  (1.626 m) 85.3 kg (188 lb)       Final Clinical Impressions(s) / ED Diagnoses   Final diagnoses:  Right leg swelling   Right leg swelling with pain, recently stopped Xarelto, with clinical concern for recurrent DVT.  Doubt PE, metabolic instability or serious bacterial infection.  Patient has recurrent swelling, the right leg and may have chronic venous insufficiency associated with multiple clots.  She may require evaluation and treatment by vascular specialist.  Nursing Notes Reviewed/ Care Coordinated Applicable Imaging Reviewed Interpretation of Laboratory Data incorporated into ED treatment  The patient appears reasonably screened and/or stabilized for discharge and I doubt any other medical condition or other [facility] requiring further screening, evaluation, or treatment in the ED at this time prior to discharge.  Plan: Home Medications-continue usual medications; Home Treatments-elevation and heat right lower leg; return  here if the recommended treatment, does not improve the symptoms; Recommended follow up-return morning for ultrasound imaging, then likely follow-up with PCP, and vascular surgery.    New Prescriptions New Prescriptions   No medications on file     Mancel BaleWentz, Johntavius Shepard, MD 02/07/17 2151

## 2017-02-08 ENCOUNTER — Ambulatory Visit (HOSPITAL_COMMUNITY)
Admission: RE | Admit: 2017-02-08 | Discharge: 2017-02-08 | Disposition: A | Discharge status: Home / Self Care | Payer: Managed Care, Other (non HMO) | Source: Ambulatory Visit | Attending: Emergency Medicine | Admitting: Emergency Medicine

## 2017-02-08 DIAGNOSIS — M7989 Other specified soft tissue disorders: Secondary | ICD-10-CM | POA: Diagnosis not present

## 2017-02-08 DIAGNOSIS — I82501 Chronic embolism and thrombosis of unspecified deep veins of right lower extremity: Secondary | ICD-10-CM | POA: Insufficient documentation

## 2017-02-08 DIAGNOSIS — M79604 Pain in right leg: Secondary | ICD-10-CM | POA: Diagnosis not present

## 2017-02-08 MED ORDER — WARFARIN SODIUM 5 MG PO TABS
5.0000 mg | ORAL_TABLET | Freq: Every day | ORAL | 0 refills | Status: DC
Start: 2017-02-08 — End: 2017-05-07

## 2017-02-08 MED ORDER — ENOXAPARIN SODIUM 150 MG/ML ~~LOC~~ SOLN
1.0000 mg/kg | Freq: Two times a day (BID) | SUBCUTANEOUS | 0 refills | Status: DC
Start: 2017-02-08 — End: 2017-03-22

## 2017-02-08 MED ORDER — RIVAROXABAN (XARELTO) VTE STARTER PACK (15 & 20 MG): ORAL_TABLET | ORAL | 0 refills | Status: DC

## 2017-02-08 NOTE — ED Provider Notes (Signed)
Pt informed of resluts - has ongoing pain and chronic DVT likely She expressed her understanding, given options of Lovenox and Coumadin or Xarelto with dosing instructions. She will follow up at the pharmacy to see which was more cost effective.   Eber HongMiller, Aysiah Jurado, MD 02/08/17 [phone]

## 2017-03-04 ENCOUNTER — Ambulatory Visit: Payer: Managed Care, Other (non HMO) | Admitting: Women's Health

## 2017-03-14 ENCOUNTER — Emergency Department (HOSPITAL_COMMUNITY)
Admission: EM | Admit: 2017-03-14 | Discharge: 2017-03-14 | Discharge status: Against Medical Advice | Payer: Managed Care, Other (non HMO)

## 2017-03-14 NOTE — ED Notes (Signed)
Pt called for triage x2, no answer. 

## 2017-03-14 NOTE — ED Triage Notes (Signed)
Pt called for triage x 1, no answer. 

## 2017-03-22 ENCOUNTER — Emergency Department (HOSPITAL_COMMUNITY)
Admission: EM | Admit: 2017-03-22 | Discharge: 2017-03-22 | Disposition: A | Discharge status: Home / Self Care | Payer: Managed Care, Other (non HMO) | Attending: Emergency Medicine | Admitting: Emergency Medicine

## 2017-03-22 ENCOUNTER — Emergency Department (HOSPITAL_COMMUNITY): Payer: Managed Care, Other (non HMO)

## 2017-03-22 ENCOUNTER — Encounter (HOSPITAL_COMMUNITY): Payer: Self-pay

## 2017-03-22 DIAGNOSIS — R079 Chest pain, unspecified: Secondary | ICD-10-CM | POA: Diagnosis present

## 2017-03-22 DIAGNOSIS — Z7901 Long term (current) use of anticoagulants: Secondary | ICD-10-CM | POA: Insufficient documentation

## 2017-03-22 LAB — MONONUCLEOSIS SCREEN: Mono Screen: NEGATIVE

## 2017-03-22 LAB — TROPONIN I

## 2017-03-22 LAB — BASIC METABOLIC PANEL
ANION GAP: 6 (ref 5–15)
BUN: 10 mg/dL (ref 6–20)
CO2: 28 mmol/L (ref 22–32)
Calcium: 8.9 mg/dL (ref 8.9–10.3)
Chloride: 102 mmol/L (ref 101–111)
Creatinine, Ser: 1.07 mg/dL — ABNORMAL HIGH (ref 0.44–1.00)
Glucose, Bld: 77 mg/dL (ref 65–99)
POTASSIUM: 3.5 mmol/L (ref 3.5–5.1)
SODIUM: 136 mmol/L (ref 135–145)

## 2017-03-22 LAB — CBC
HEMATOCRIT: 37.8 % (ref 36.0–46.0)
HEMOGLOBIN: 12.3 g/dL (ref 12.0–15.0)
MCH: 26.2 pg (ref 26.0–34.0)
MCHC: 32.5 g/dL (ref 30.0–36.0)
MCV: 80.4 fL (ref 78.0–100.0)
Platelets: 271 10*3/uL (ref 150–400)
RBC: 4.7 MIL/uL (ref 3.87–5.11)
RDW: 14.9 % (ref 11.5–15.5)
WBC: 6.1 10*3/uL (ref 4.0–10.5)

## 2017-03-22 LAB — RAPID STREP SCREEN (MED CTR MEBANE ONLY): Streptococcus, Group A Screen (Direct): NEGATIVE

## 2017-03-22 MED ORDER — IOPAMIDOL (ISOVUE-370) INJECTION 76%
100.0000 mL | Freq: Once | INTRAVENOUS | Status: AC | PRN
  Administered 2017-03-22: 100 mL via INTRAVENOUS

## 2017-03-22 MED ORDER — SODIUM CHLORIDE 0.9 % IV BOLUS (SEPSIS)
500.0000 mL | Freq: Once | INTRAVENOUS | Status: AC
  Administered 2017-03-22: 500 mL via INTRAVENOUS

## 2017-03-22 NOTE — ED Provider Notes (Signed)
[facility] Provider Note   CSN: [id number] Arrival date & time: 03/22/17  1328     History   Chief Complaint Chief Complaint  Patient presents with  . Chest Pain    HPI Brandy Barker is a 26 y.o. female.  HPI  Patient presents with shortness of breath and chest pain. Has had it for the last few days. States that she she is worried about pulmonary embolism. History of DVT. She has been off her Xarelto due to cost reasons. No hemoptysis. Pain is mildly sharp and in the sternal to right parasternal area. No fevers. States she's had some mild shortness of breath. States that she has a protein S deficiency. The swelling in her leg has been rather stable.  Past Medical History:  Diagnosis Date  . DVT (deep venous thrombosis) [facility](HCC)     Patient Active Problem List   Diagnosis Date Noted  . History of DVT of lower extremity 06/25/2016  . Vaginal discharge 06/25/2016  . BV (bacterial vaginosis) 06/25/2016  . Encounter for gynecological examination with Papanicolaou smear of cervix 06/25/2016    Past Surgical History:  Procedure Laterality Date  . CESAREAN SECTION    . cesearan      OB History    Gravida Para Term Preterm AB Living   5 3 3   2 3    SAB TAB Ectopic Multiple Live Births   2       3       Home Medications    Prior to Admission medications   Medication Sig Start Date End Date Taking? Authorizing Provider  Rivaroxaban 15 & 20 MG TBPK Take as directed on package: Start with one 15mg  tablet by mouth twice a day with food. On Day 22, switch to one 20mg  tablet once a day with food. Patient taking differently: Take 20 mg by mouth daily.  02/08/17  Yes Eber HongMiller, Brian, MD  warfarin (COUMADIN) 5 MG tablet Take 1 tablet (5 mg total) by mouth daily. Patient not taking: Reported on 03/22/2017 02/08/17   Eber HongMiller, Brian, MD    Family History Family History  Problem Relation Age of Onset  . Cancer Paternal Grandfather   . Diabetes Paternal Grandmother   . Cancer  Paternal Grandmother   . Hypertension Father   . Other Sister        DVT    Social History Social History  Substance Use Topics  . Smoking status: Never Smoker  . Smokeless tobacco: Never Used  . Alcohol use No     Allergies   Patient has no known allergies.   Review of Systems Review of Systems  Constitutional: Negative for appetite change.  HENT: Negative for congestion.   Respiratory: Positive for shortness of breath.   Cardiovascular: Positive for chest pain and leg swelling.  Gastrointestinal: Negative for abdominal pain.  Genitourinary: Negative for flank pain.  Musculoskeletal: Negative for back pain.  Neurological: Negative for numbness.  Hematological: Negative for adenopathy.  Psychiatric/Behavioral: Negative for confusion.     Physical Exam Updated Vital Signs BP (!) 102/58   Pulse 66   Temp 98 F (36.7 C) (Oral)   Resp (!) 21   Wt 85.3 kg (188 lb)   LMP 03/06/2017 Comment: shielded  SpO2 100%   BMI 32.27 kg/m   Physical Exam  Constitutional: She appears well-developed.  HENT:  Head: Normocephalic.  Eyes: Pupils are equal, round, and reactive to light.  Cardiovascular: Normal rate.   Pulmonary/Chest: Effort normal. No respiratory distress.  She exhibits no tenderness.  Abdominal: Soft. There is no tenderness.  Musculoskeletal: She exhibits edema.  Edema of right lower extremity.  Neurological: She is alert.  Skin: Skin is warm. Capillary refill takes less than 2 seconds.  Psychiatric: She has a normal mood and affect.     ED Treatments / Results  Labs (all labs ordered are listed, but only abnormal results are displayed) Labs Reviewed  BASIC METABOLIC PANEL - Abnormal; Notable for the following:       Result Value   Creatinine, Ser 1.07 (*)    All other components within normal limits  RAPID STREP SCREEN (NOT AT [facility])  CULTURE, GROUP A STREP [facility])  CBC  TROPONIN I  MONONUCLEOSIS SCREEN    EKG  EKG  Interpretation  Date/Time:  Friday March 22 2017 13:32:45 EDT Ventricular Rate:  77 PR Interval:  148 QRS Duration: 82 QT Interval:  376 QTC Calculation: 425 R Axis:   44 Text Interpretation:  Normal sinus rhythm Nonspecific T wave abnormality Abnormal ECG Confirmed by Benjiman Core [phone]) on 03/22/2017 3:35:50 PM       Radiology Dg Chest 2 View  Result Date: 03/22/2017 CLINICAL DATA:  Chest pain EXAM: CHEST  2 VIEW COMPARISON:  None. FINDINGS: The heart size and mediastinal contours are within normal limits. Both lungs are clear. The visualized skeletal structures are unremarkable. IMPRESSION: No active cardiopulmonary disease. Electronically Signed   By: Alcide Clever M.D.   On: 03/22/2017 14:03   Ct Angio Chest Pe W And/or Wo Contrast  Result Date: 03/22/2017 CLINICAL DATA:  Chest pain and shortness of breath over the last several days. History of deep venous thrombosis 2016. EXAM: CT ANGIOGRAPHY CHEST WITH CONTRAST TECHNIQUE: Multidetector CT imaging of the chest was performed using the standard protocol during bolus administration of intravenous contrast. Multiplanar CT image reconstructions and MIPs were obtained to evaluate the vascular anatomy. CONTRAST:  100 cc Isovue 370 COMPARISON:  Chest radiography same day FINDINGS: Cardiovascular: Pulmonary arterial opacification is good. No pulmonary emboli. The aorta is normal. No coronary artery calcification. Heart size is normal. No pericardial fluid. Mediastinum/Nodes: Normal Lungs/Pleura: Normal Upper Abdomen: Normal Musculoskeletal: Normal Review of the MIP images confirms the above findings. IMPRESSION: Normal examination. No pulmonary emboli. No other abnormal chest finding to explain the patient's symptoms. Electronically Signed   By: Paulina Fusi M.D.   On: 03/22/2017 17:06    Procedures Procedures (including critical care time)  Medications Ordered in ED Medications  sodium chloride 0.9 % bolus 500 mL (500 mLs Intravenous  New Bag/Given 03/22/17 1630)  iopamidol (ISOVUE-370) 76 % injection 100 mL (100 mLs Intravenous Contrast Given 03/22/17 1643)     Initial Impression / Assessment and Plan / ED Course  I have reviewed the triage vital signs and the nursing notes.  Pertinent labs & imaging results that were available during my care of the patient were reviewed by me and considered in my medical decision making (see chart for details).     Patient with chest pain. History of DVT and protein S deficiency. Off her Xarelto. Labs reassuring. CT angiography does not show pulmonary embolism. Doubt cardiac cause. Will discharge home. Attempt to contact social worker for medication assistance but they're not here now. She will apparently be followed by them. Discharge home.  Final Clinical Impressions(s) / ED Diagnoses   Final diagnoses:  Nonspecific chest pain    New Prescriptions New Prescriptions   No medications on file     Yurika Pereda,  Harrold Donath, MD 03/22/17 [phone]

## 2017-03-22 NOTE — ED Triage Notes (Signed)
Pt reports that she has been experiencing CP and some shortness of breath for several days. Pt has hx of DVT in 2016. Pt reports that pain comes and goes

## 2017-03-22 NOTE — ED Notes (Signed)
Patient transported to CT 

## 2017-03-23 LAB — CULTURE, GROUP A STREP (THRC)

## 2017-03-23 NOTE — ED Notes (Signed)
Contacted Social [phone]), covering [facility],  AP today.  She will call Pt as requested by Dr Rubin PayorPickering to get her help to get her Xarelto.

## 2017-05-07 ENCOUNTER — Encounter: Payer: Self-pay | Admitting: Adult Health

## 2017-05-07 ENCOUNTER — Ambulatory Visit (INDEPENDENT_AMBULATORY_CARE_PROVIDER_SITE_OTHER): Payer: Managed Care, Other (non HMO) | Admitting: Adult Health

## 2017-05-07 VITALS — BP 108/61 | HR 81 | Ht 64.0 in | Wt 181.0 lb

## 2017-05-07 DIAGNOSIS — Z113 Encounter for screening for infections with a predominantly sexual mode of transmission: Secondary | ICD-10-CM | POA: Diagnosis not present

## 2017-05-07 DIAGNOSIS — N760 Acute vaginitis: Secondary | ICD-10-CM | POA: Diagnosis not present

## 2017-05-07 DIAGNOSIS — B9689 Other specified bacterial agents as the cause of diseases classified elsewhere: Secondary | ICD-10-CM

## 2017-05-07 DIAGNOSIS — N898 Other specified noninflammatory disorders of vagina: Secondary | ICD-10-CM

## 2017-05-07 DIAGNOSIS — R350 Frequency of micturition: Secondary | ICD-10-CM

## 2017-05-07 LAB — POCT URINALYSIS DIPSTICK
Blood, UA: NEGATIVE
Glucose, UA: NEGATIVE
KETONES UA: NEGATIVE
Leukocytes, UA: NEGATIVE
Nitrite, UA: NEGATIVE
Protein, UA: NEGATIVE

## 2017-05-07 LAB — POCT WET PREP (WET MOUNT)
CLUE CELLS WET PREP WHIFF POC: POSITIVE
WBC, Wet Prep HPF POC: POSITIVE

## 2017-05-07 MED ORDER — METRONIDAZOLE 500 MG PO TABS: 500.0000 mg | ORAL_TABLET | Freq: Two times a day (BID) | ORAL | 1 refills | Status: DC

## 2017-05-07 NOTE — Patient Instructions (Signed)
Bacterial Vaginosis Bacterial vaginosis is a vaginal infection that occurs when the normal balance of bacteria in the vagina is disrupted. It results from an overgrowth of certain bacteria. This is the most common vaginal infection among women ages 15-44. Because bacterial vaginosis increases your risk for STIs (sexually transmitted infections), getting treated can help reduce your risk for chlamydia, gonorrhea, herpes, and HIV (human immunodeficiency virus). Treatment is also important for preventing complications in pregnant women, because this condition can cause an early (premature) delivery. What are the causes? This condition is caused by an increase in harmful bacteria that are normally present in small amounts in the vagina. However, the reason that the condition develops is not fully understood. What increases the risk? The following factors may make you more likely to develop this condition:  Having a new sexual partner or multiple sexual partners.  Having unprotected sex.  Douching.  Having an intrauterine device (IUD).  Smoking.  Drug and alcohol abuse.  Taking certain antibiotic medicines.  Being pregnant.  You cannot get bacterial vaginosis from toilet seats, bedding, swimming pools, or contact with objects around you. What are the signs or symptoms? Symptoms of this condition include:  Grey or white vaginal discharge. The discharge can also be watery or foamy.  A fish-like odor with discharge, especially after sexual intercourse or during menstruation.  Itching in and around the vagina.  Burning or pain with urination.  Some women with bacterial vaginosis have no signs or symptoms. How is this diagnosed? This condition is diagnosed based on:  Your medical history.  A physical exam of the vagina.  Testing a sample of vaginal fluid under a microscope to look for a large amount of bad bacteria or abnormal cells. Your health care provider may use a cotton swab  or a small wooden spatula to collect the sample.  How is this treated? This condition is treated with antibiotics. These may be given as a pill, a vaginal cream, or a medicine that is put into the vagina (suppository). If the condition comes back after treatment, a second round of antibiotics may be needed. Follow these instructions at home: Medicines  Take over-the-counter and prescription medicines only as told by your health care provider.  Take or use your antibiotic as told by your health care provider. Do not stop taking or using the antibiotic even if you start to feel better. General instructions  If you have a female sexual partner, tell her that you have a vaginal infection. She should see her health care provider and be treated if she has symptoms. If you have a female sexual partner, he does not need treatment.  During treatment: ? Avoid sexual activity until you finish treatment. ? Do not douche. ? Avoid alcohol as directed by your health care provider. ? Avoid breastfeeding as directed by your health care provider.  Drink enough water and fluids to keep your urine clear or pale yellow.  Keep the area around your vagina and rectum clean. ? Wash the area daily with warm water. ? Wipe yourself from front to back after using the toilet.  Keep all follow-up visits as told by your health care provider. This is important. How is this prevented?  Do not douche.  Wash the outside of your vagina with warm water only.  Use protection when having sex. This includes latex condoms and dental dams.  Limit how many sexual partners you have. To help prevent bacterial vaginosis, it is best to have sex with just   one partner (monogamous).  Make sure you and your sexual partner are tested for STIs.  Wear cotton or cotton-lined underwear.  Avoid wearing tight pants and pantyhose, especially during summer.  Limit the amount of alcohol that you drink.  Do not use any products that  contain nicotine or tobacco, such as cigarettes and e-cigarettes. If you need help quitting, ask your health care provider.  Do not use illegal drugs. Where to find more information:  Centers for Disease Control and Prevention: www.cdc.gov/std  American Sexual Health Association (ASHA): www.ashastd.org  U.S. Department of Health and Human Services, Office on Women's Health: www.womenshealth.gov/ or https://www.womenshealth.gov/a-z-topics/bacterial-vaginosis Contact a health care provider if:  Your symptoms do not improve, even after treatment.  You have more discharge or pain when urinating.  You have a fever.  You have pain in your abdomen.  You have pain during sex.  You have vaginal bleeding between periods. Summary  Bacterial vaginosis is a vaginal infection that occurs when the normal balance of bacteria in the vagina is disrupted.  Because bacterial vaginosis increases your risk for STIs (sexually transmitted infections), getting treated can help reduce your risk for chlamydia, gonorrhea, herpes, and HIV (human immunodeficiency virus). Treatment is also important for preventing complications in pregnant women, because the condition can cause an early (premature) delivery.  This condition is treated with antibiotic medicines. These may be given as a pill, a vaginal cream, or a medicine that is put into the vagina (suppository). This information is not intended to replace advice given to you by your health care provider. Make sure you discuss any questions you have with your health care provider. Document Released: 08/20/2005 Document Revised: 05/05/2016 Document Reviewed: 05/05/2016 Elsevier Interactive Patient Education  2017 [employer]vier Inc.  

## 2017-05-07 NOTE — Progress Notes (Signed)
Subjective:     Patient ID: Brandy Barker, female   DOB: 07/07/1991, 26 y.o.   MRN: [id number]  HPI [name] is a 26 year old white female, in for STD screening and has vaginal discharge and occasional odor, and urinary frequency.   Review of Systems +Vaginal discharge,occasuinal odor +Urinary frequency  Using condoms with sex History of DVT 2016, not on meds at present,[place]   Reviewed past medical,surgical, social and family history. Reviewed medications and allergies.     Objective:   Physical Exam BP 108/61 (BP Location: Right Arm, Patient Position: Sitting, Cuff Size: Normal)   Pulse 81   Ht 5\' 4"  (1.626 m)   Wt 181 lb (82.1 kg)   LMP 04/26/2017 (Approximate)   BMI 31.07 kg/m  Urine dipstick negative. Skin warm and dry.Pelvic: external genitalia is normal in appearance no lesions, vagina: white discharge with odor,urethra has no lesions or masses noted, cervix:smooth and bulbous, uterus: normal size, shape and contour, non tender, no masses felt, adnexa: no masses or tenderness noted. Bladder is non tender and no masses felt. Wet prep: + for clue cells and +WBCs. GC/CHL obtained.     Assessment:     1. BV (bacterial vaginosis)   2. Urinary frequency   3. Vaginal discharge   4. Screening for STD (sexually transmitted disease)        Plan:     Check HIV and RPR GC/CHL sent Rx flagyl 500 mg 1 bid x 7 days,with 1 refill no alcohol, review handout on BV   Follow  Up prn

## 2017-05-08 LAB — GC/CHLAMYDIA PROBE AMP
CHLAMYDIA, DNA PROBE: NEGATIVE
NEISSERIA GONORRHOEAE BY PCR: NEGATIVE

## 2017-05-08 LAB — RPR: RPR Ser Ql: NONREACTIVE

## 2017-05-08 LAB — HIV ANTIBODY (ROUTINE TESTING W REFLEX): HIV SCREEN 4TH GENERATION: NONREACTIVE

## 2017-06-26 ENCOUNTER — Encounter: Payer: Self-pay | Admitting: *Deleted

## 2017-06-26 ENCOUNTER — Other Ambulatory Visit: Payer: Self-pay | Admitting: Adult Health

## 2017-06-26 IMAGING — US US EXTREM LOW VENOUS*R*
1 series · 13 of 24 positions shown · non-contrast
Comparison: None.

CLINICAL DATA: Right lower calf pain for 2 weeks.



[Series 1: us extrem low venous*right* · 0.05mm/px · 13 of 40 slices shown]
[im 1/40]
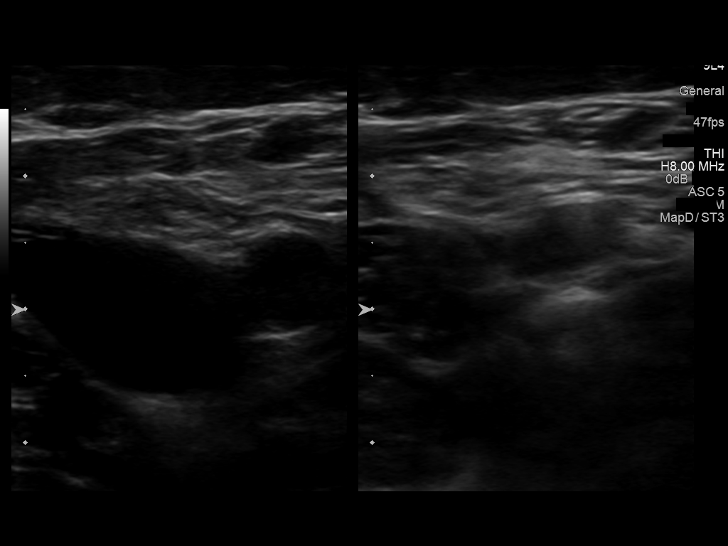
[im 4/40]
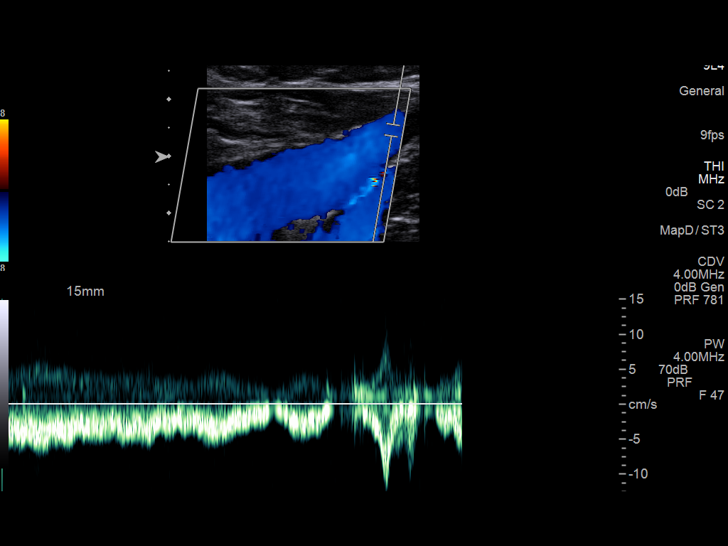
[im 7/40]
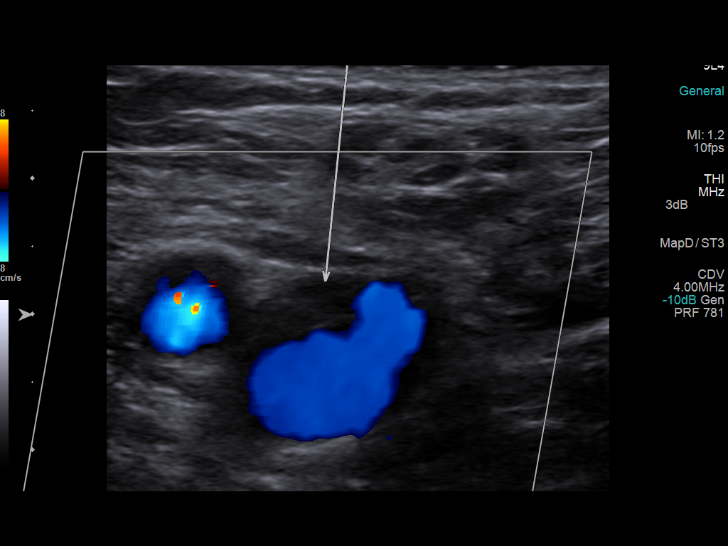
[im 11/40]
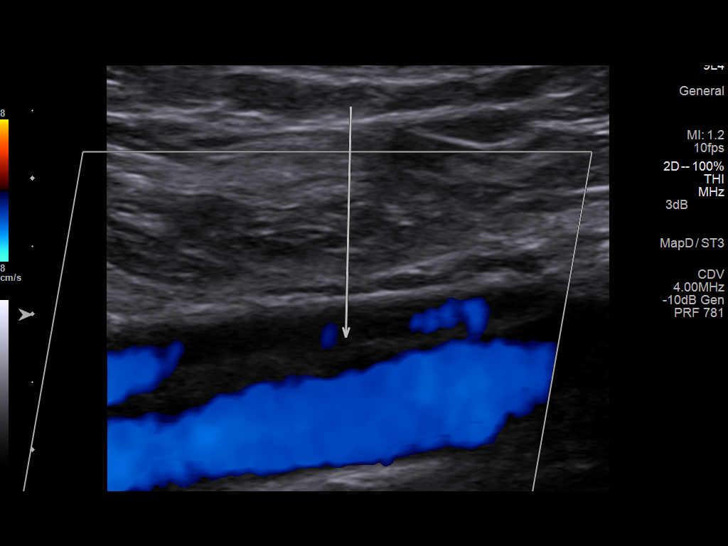
[im 14/40]
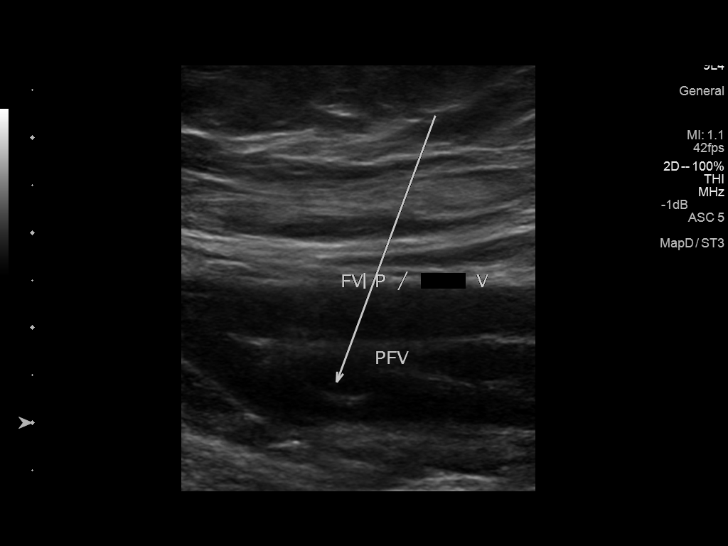
[im 17/40]
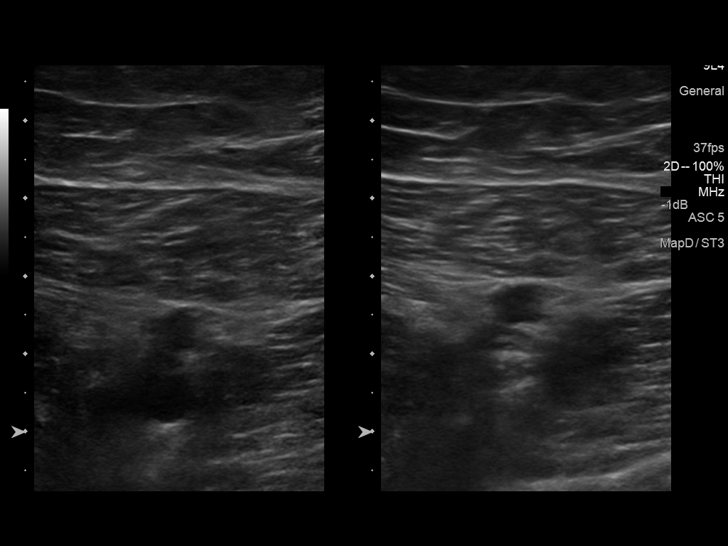
[im 21/40]
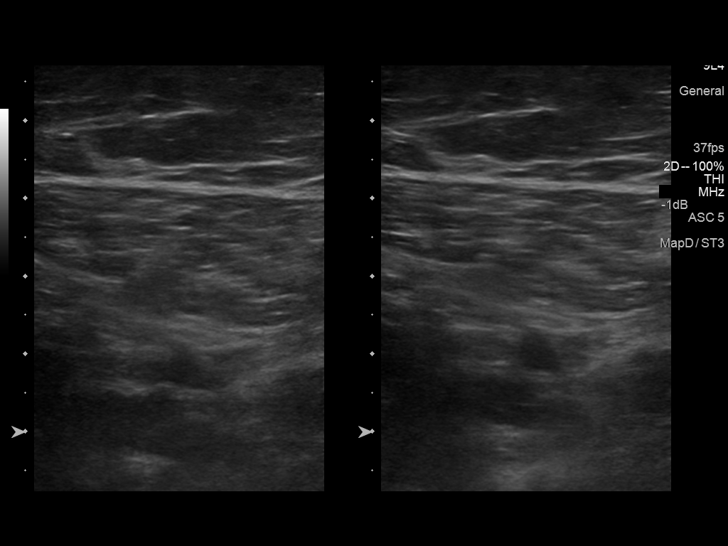
[im 23/40]
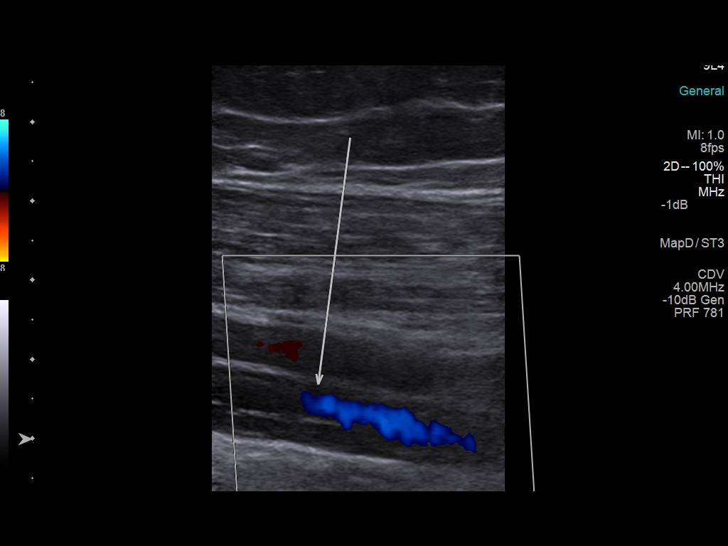
[im 26/40]
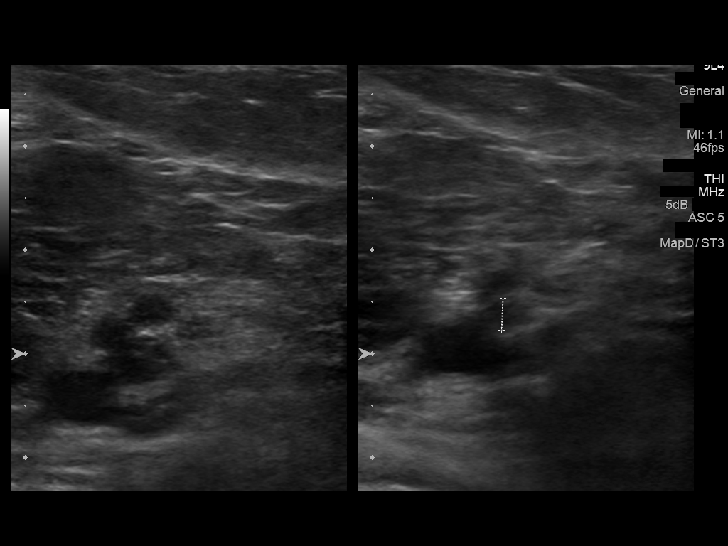
[im 29/40]
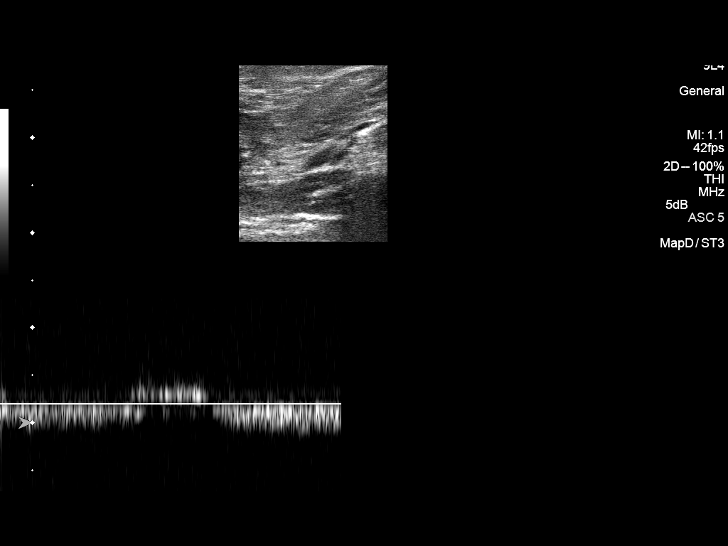
[im 33/40]
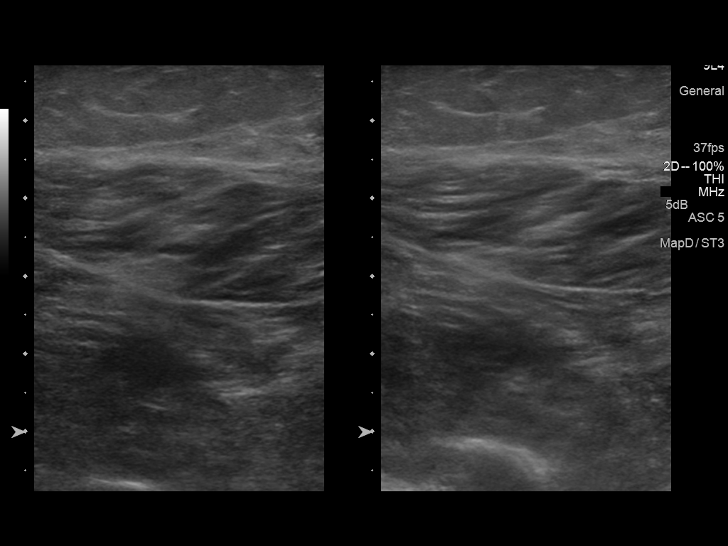
[im 36/40]
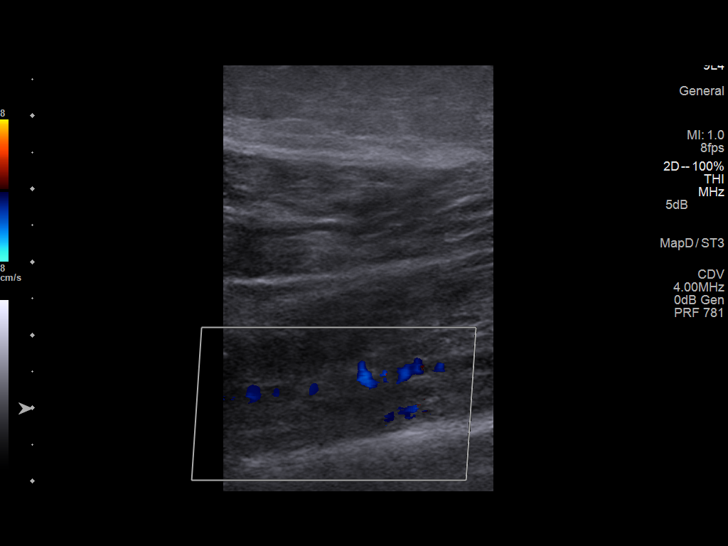
[im 40/40]
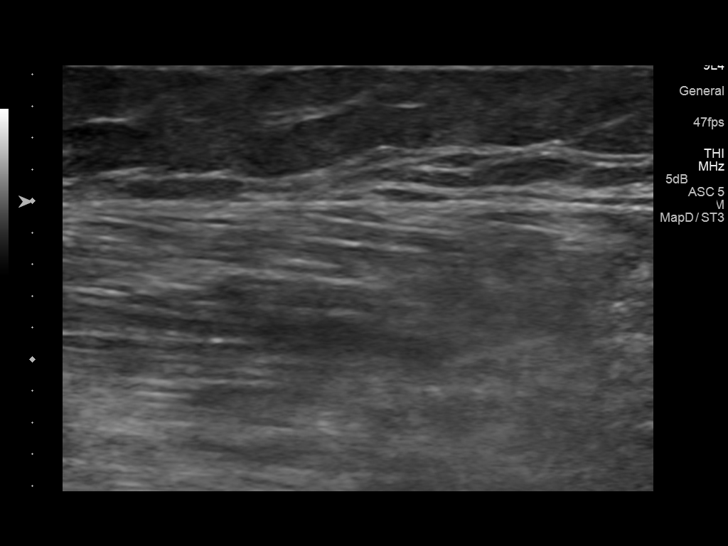

[13 of 24 positions shown; findings below may reference images not displayed]

FINDINGS: Contralateral Common Femoral Vein: Respiratory phasicity is normal
and symmetric with the symptomatic side. No evidence of thrombus.
Normal compressibility.

Common Femoral Vein: Thrombus is noted with partial compressibility
and phasicity consistent with nonocclusive thrombus.

Saphenofemoral Junction: Nonocclusive thrombus is noted.

Profunda Femoral Vein: Nonocclusive thrombus is noted.

Femoral Vein: Nonocclusive thrombus is noted.

Popliteal Vein: Nonocclusive thrombus is noted.

Calf Veins: Nonocclusive thrombus is noted in posterior tibial vein.
Peroneal vein is not well visualized.

Superficial Great Saphenous Vein: No evidence of thrombus. Normal
compressibility and flow on color Doppler imaging.

Venous Reflux:  None.

Other Findings:  None.
IMPRESSION: Acute nonocclusive deep venous thrombosis is seen in the right lower
extremity extending from right common femoral vein to right
posterior tibial vein.

## 2017-08-05 ENCOUNTER — Emergency Department (HOSPITAL_COMMUNITY)
Admission: EM | Admit: 2017-08-05 | Discharge: 2017-08-05 | Disposition: A | Discharge status: Home / Self Care | Payer: Managed Care, Other (non HMO) | Attending: Emergency Medicine | Admitting: Emergency Medicine

## 2017-08-05 ENCOUNTER — Other Ambulatory Visit: Payer: Self-pay

## 2017-08-05 DIAGNOSIS — R1030 Lower abdominal pain, unspecified: Secondary | ICD-10-CM | POA: Insufficient documentation

## 2017-08-05 LAB — CBC
HCT: 35.9 % — ABNORMAL LOW (ref 36.0–46.0)
Hemoglobin: 11.5 g/dL — ABNORMAL LOW (ref 12.0–15.0)
MCH: 26.1 pg (ref 26.0–34.0)
MCHC: 32 g/dL (ref 30.0–36.0)
MCV: 81.6 fL (ref 78.0–100.0)
Platelets: 282 10*3/uL (ref 150–400)
RBC: 4.4 MIL/uL (ref 3.87–5.11)
RDW: 15.4 % (ref 11.5–15.5)
WBC: 7.3 10*3/uL (ref 4.0–10.5)

## 2017-08-05 LAB — COMPREHENSIVE METABOLIC PANEL
ALBUMIN: 3.8 g/dL (ref 3.5–5.0)
ALK PHOS: 40 U/L (ref 38–126)
ALT: 20 U/L (ref 14–54)
AST: 21 U/L (ref 15–41)
Anion gap: 7 (ref 5–15)
BILIRUBIN TOTAL: 0.6 mg/dL (ref 0.3–1.2)
BUN: 19 mg/dL (ref 6–20)
CALCIUM: 8.5 mg/dL — AB (ref 8.9–10.3)
CO2: 22 mmol/L (ref 22–32)
Chloride: 107 mmol/L (ref 101–111)
Creatinine, Ser: 0.83 mg/dL (ref 0.44–1.00)
GFR calc Af Amer: >60 mL/min (ref >60)
GFR calc non Af Amer: >60 mL/min (ref >60)
GLUCOSE: 110 mg/dL — AB (ref 65–99)
Potassium: 3.5 mmol/L (ref 3.5–5.1)
Sodium: 136 mmol/L (ref 135–145)
TOTAL PROTEIN: 7.7 g/dL (ref 6.5–8.1)

## 2017-08-05 LAB — I-STAT BETA HCG BLOOD, ED (MC, WL, AP ONLY)

## 2017-08-05 LAB — POC OCCULT BLOOD, ED: Fecal Occult Bld: NEGATIVE

## 2017-08-05 LAB — LIPASE, BLOOD: Lipase: 39 U/L (ref 11–51)

## 2017-08-05 NOTE — ED Provider Notes (Addendum)
[facility] EMERGENCY DEPARTMENT Provider Note   CSN: [id number] Arrival date & time: 08/05/17  1816     History   Chief Complaint Chief Complaint  Patient presents with  . Abdominal Pain    HPI Brandy Barker is a 26 y.o. female.  Since with lower crampy abdominal pain onset 1.5 weeks ago pain lasts for 2 hours of time goes away for 2 hours at a time no associated nausea or vomiting.  She reports frequent bowel movements some of which are loose and some of which are mixed with a slight amount of blood which she sees when she wipes.  She denies any fever.  No treatment prior to coming here.  Nothing makes symptoms better or worse.  She denies urinary symptoms denies vaginal discharge.  Last normal menstrual period was July 16, 2017.  She is presently asymptomatic last bowel movement earlier today, normal  HPI  Past Medical History:  Diagnosis Date  . DVT (deep venous thrombosis) (HCC)   . Protein S deficiency (HCC) 2016    Patient Active Problem List   Diagnosis Date Noted  . History of DVT of lower extremity 06/25/2016  . Vaginal discharge 06/25/2016  . BV (bacterial vaginosis) 06/25/2016  . Encounter for gynecological examination with Papanicolaou smear of cervix 06/25/2016    Past Surgical History:  Procedure Laterality Date  . CESAREAN SECTION    . cesearan      OB History    Gravida Para Term Preterm AB Living   5 3 3   2 3    SAB TAB Ectopic Multiple Live Births   2       3       Home Medications    Prior to Admission medications   Not on File    Family History Family History  Problem Relation Age of Onset  . Cancer Paternal Grandfather   . Diabetes Paternal Grandmother   . Cancer Paternal Grandmother   . Hypertension Father   . Other Sister        DVT    Social History Social History   Tobacco Use  . Smoking status: Never Smoker  . Smokeless tobacco: Never Used  Substance Use Topics  . Alcohol use: No  . Drug use: No      Allergies   Patient has no known allergies.   Review of Systems Review of Systems  Constitutional: Negative.   HENT: Negative.   Respiratory: Negative.   Cardiovascular: Negative.   Gastrointestinal: Positive for abdominal pain, blood in stool and diarrhea.  Musculoskeletal: Negative.   Skin: Negative.   Neurological: Negative.   Psychiatric/Behavioral: Negative.   All other systems reviewed and are negative.    Physical Exam Updated Vital Signs BP 117/73 (BP Location: Left Arm)   Pulse 90   Temp 98.6 F (37 C) (Oral)   Resp 14   Ht 5\' 3"  (1.6 m)   Wt 83 kg (183 lb)   LMP 07/16/2017 (Approximate)   SpO2 100%   BMI 32.42 kg/m   Physical Exam  Constitutional: She appears well-developed and well-nourished.  HENT:  Head: Normocephalic and atraumatic.  Eyes: Conjunctivae are normal. Pupils are equal, round, and reactive to light.  Neck: Neck supple. No tracheal deviation present. No thyromegaly present.  Cardiovascular: Normal rate and regular rhythm.  No murmur heard. Pulmonary/Chest: Effort normal and breath sounds normal.  Abdominal: Soft. Bowel sounds are normal. She exhibits no distension. There is tenderness.  Minimally tender at infraumbilical area  Genitourinary: Rectal exam shows guaiac negative stool.  Genitourinary Comments: Normal tone brown stool Hemoccult negative  Musculoskeletal: Normal range of motion. She exhibits no edema or tenderness.  Neurological: She is alert. Coordination normal.  Skin: Skin is warm and dry. No rash noted.  Psychiatric: She has a normal mood and affect.  Nursing note and vitals reviewed.    ED Treatments / Results  Labs (all labs ordered are listed, but only abnormal results are displayed) Labs Reviewed  COMPREHENSIVE METABOLIC PANEL - Abnormal; Notable for the following components:      Result Value   Glucose, Bld 110 (*)    Calcium 8.5 (*)    All other components within normal limits  CBC - Abnormal; Notable  for the following components:   Hemoglobin 11.5 (*)    HCT 35.9 (*)    All other components within normal limits  LIPASE, BLOOD  I-STAT BETA HCG BLOOD, ED ([facility], [facility], [facility] ONLY)  POC OCCULT BLOOD, ED   Results for orders placed or performed during the hospital encounter of 08/05/17  Lipase, blood  Result Value Ref Range   Lipase 39 11 - 51 U/L  Comprehensive metabolic panel  Result Value Ref Range   Sodium 136 135 - 145 mmol/L   Potassium 3.5 3.5 - 5.1 mmol/L   Chloride 107 101 - 111 mmol/L   CO2 22 22 - 32 mmol/L   Glucose, Bld 110 (H) 65 - 99 mg/dL   BUN 19 6 - 20 mg/dL   Creatinine, Ser 4.090.83 0.44 - 1.00 mg/dL   Calcium 8.5 (L) 8.9 - 10.3 mg/dL   Total Protein 7.7 6.5 - 8.1 g/dL   Albumin 3.8 3.5 - 5.0 g/dL   AST 21 15 - 41 U/L   ALT 20 14 - 54 U/L   Alkaline Phosphatase 40 38 - 126 U/L   Total Bilirubin 0.6 0.3 - 1.2 mg/dL   GFR calc non Af Amer >60 >60 mL/min   GFR calc Af Amer >60 >60 mL/min   Anion gap 7 5 - 15  CBC  Result Value Ref Range   WBC 7.3 4.0 - 10.5 K/uL   RBC 4.40 3.87 - 5.11 MIL/uL   Hemoglobin 11.5 (L) 12.0 - 15.0 g/dL   HCT 81.135.9 (L) 91.436.0 - 78.246.0 %   MCV 81.6 78.0 - 100.0 fL   MCH 26.1 26.0 - 34.0 pg   MCHC 32.0 30.0 - 36.0 g/dL   RDW 95.615.4 21.311.5 - 08.615.5 %   Platelets 282 150 - 400 K/uL  I-Stat beta hCG blood, ED  Result Value Ref Range   I-stat hCG, quantitative <5.0 <5 mIU/mL   Comment 3          POC occult blood, ED  Result Value Ref Range   Fecal Occult Bld NEGATIVE NEGATIVE   No results found. EKG  EKG Interpretation None       Radiology No results found.  Procedures Procedures (including critical care time)  Medications Ordered in ED Medications - No data to display   Initial Impression / Assessment and Plan / ED Course  I have reviewed the triage vital signs and the nursing notes.  Pertinent labs & imaging results that were available during my care of the patient were reviewed by me and considered in my medical decision making  (see chart for details).     Pain is felt to be nonspecific.  She has less than 1 g drop of hemoglobin over 4 months ago.  Hemoccult negative stool.  Plan referral to PCP or Dr.Rourk symptoms continue to week emergent imaging not needed.  Patient agrees  Final Clinical Impressions(s) / ED Diagnoses  Dx lower abdominal pain Final diagnoses:  None    ED Discharge Orders    None       Doug SouJacubowitz, Jahne Krukowski, MD 08/05/17 2203    Doug SouJacubowitz, Hilaria Titsworth, MD 08/05/17 2203

## 2017-08-05 NOTE — Discharge Instructions (Signed)
Call your primary care physician or Dr. Jena Gaussourk if having significant discomfort in a week.  Return to the emergency department if you develop fever, pain worsens or are concerned for any reason

## 2017-08-05 NOTE — ED Triage Notes (Signed)
Pt states that she has been having a lot to stools some loose she states that she is having some blood in it also

## 2017-09-02 ENCOUNTER — Encounter (HOSPITAL_COMMUNITY): Payer: Self-pay | Admitting: Emergency Medicine

## 2017-09-02 ENCOUNTER — Emergency Department (HOSPITAL_COMMUNITY): Payer: Self-pay

## 2017-09-02 ENCOUNTER — Emergency Department (HOSPITAL_COMMUNITY)
Admission: EM | Admit: 2017-09-02 | Discharge: 2017-09-02 | Disposition: A | Discharge status: Home / Self Care | Payer: Self-pay | Attending: Emergency Medicine | Admitting: Emergency Medicine

## 2017-09-02 DIAGNOSIS — B9689 Other specified bacterial agents as the cause of diseases classified elsewhere: Secondary | ICD-10-CM | POA: Insufficient documentation

## 2017-09-02 DIAGNOSIS — O26891 Other specified pregnancy related conditions, first trimester: Secondary | ICD-10-CM | POA: Insufficient documentation

## 2017-09-02 DIAGNOSIS — N760 Acute vaginitis: Secondary | ICD-10-CM | POA: Insufficient documentation

## 2017-09-02 DIAGNOSIS — Z3A01 Less than 8 weeks gestation of pregnancy: Secondary | ICD-10-CM | POA: Insufficient documentation

## 2017-09-02 DIAGNOSIS — R102 Pelvic and perineal pain: Secondary | ICD-10-CM | POA: Insufficient documentation

## 2017-09-02 LAB — URINALYSIS, ROUTINE W REFLEX MICROSCOPIC
BILIRUBIN URINE: NEGATIVE
Bacteria, UA: NONE SEEN
GLUCOSE, UA: NEGATIVE mg/dL
Hgb urine dipstick: NEGATIVE
KETONES UR: NEGATIVE mg/dL
Nitrite: NEGATIVE
PH: 7 (ref 5.0–8.0)
PROTEIN: NEGATIVE mg/dL
Specific Gravity, Urine: 1.019 (ref 1.005–1.030)

## 2017-09-02 LAB — BASIC METABOLIC PANEL
ANION GAP: 8 (ref 5–15)
BUN: 10 mg/dL (ref 6–20)
CHLORIDE: 103 mmol/L (ref 101–111)
CO2: 22 mmol/L (ref 22–32)
Calcium: 8.4 mg/dL — ABNORMAL LOW (ref 8.9–10.3)
Creatinine, Ser: 0.7 mg/dL (ref 0.44–1.00)
GFR calc Af Amer: >60 mL/min (ref >60)
GLUCOSE: 123 mg/dL — AB (ref 65–99)
POTASSIUM: 3.4 mmol/L — AB (ref 3.5–5.1)
Sodium: 133 mmol/L — ABNORMAL LOW (ref 135–145)

## 2017-09-02 LAB — CBC WITH DIFFERENTIAL/PLATELET
BASOS ABS: 0 10*3/uL (ref 0.0–0.1)
Basophils Relative: 0 %
EOS PCT: 3 %
Eosinophils Absolute: 0.2 10*3/uL (ref 0.0–0.7)
HCT: 38.5 % (ref 36.0–46.0)
HEMOGLOBIN: 12.2 g/dL (ref 12.0–15.0)
LYMPHS ABS: 1.7 10*3/uL (ref 0.7–4.0)
LYMPHS PCT: 24 %
MCH: 25.8 pg — AB (ref 26.0–34.0)
MCHC: 31.7 g/dL (ref 30.0–36.0)
MCV: 81.4 fL (ref 78.0–100.0)
Monocytes Absolute: 0.5 10*3/uL (ref 0.1–1.0)
Monocytes Relative: 8 %
NEUTROS ABS: 4.4 10*3/uL (ref 1.7–7.7)
NEUTROS PCT: 65 %
PLATELETS: 242 10*3/uL (ref 150–400)
RBC: 4.73 MIL/uL (ref 3.87–5.11)
RDW: 15.5 % (ref 11.5–15.5)
WBC: 6.8 10*3/uL (ref 4.0–10.5)

## 2017-09-02 LAB — ABO/RH: ABO/RH(D): A POS

## 2017-09-02 LAB — WET PREP, GENITAL
SPERM: NONE SEEN
Trich, Wet Prep: NONE SEEN
Yeast Wet Prep HPF POC: NONE SEEN

## 2017-09-02 LAB — HCG, QUANTITATIVE, PREGNANCY: HCG, BETA CHAIN, QUANT, S: 60698 m[IU]/mL — AB (ref <5)

## 2017-09-02 MED ORDER — VALACYCLOVIR HCL 1 G PO TABS: 1000.0000 mg | ORAL_TABLET | Freq: Three times a day (TID) | ORAL | 0 refills | Status: AC

## 2017-09-02 MED ORDER — METRONIDAZOLE 500 MG PO TABS: 500.0000 mg | ORAL_TABLET | Freq: Two times a day (BID) | ORAL | 0 refills | Status: DC

## 2017-09-02 NOTE — ED Notes (Signed)
This is pt's 6th pregnancy Reports spotting on and off Has not seen OB yet Here for evaluation

## 2017-09-02 NOTE — ED Provider Notes (Signed)
[facility] EMERGENCY DEPARTMENT Provider Note   CSN: [id number] Arrival date & time: 09/02/17  1111     History   Chief Complaint Chief Complaint  Patient presents with  . Vaginal Bleeding    HPI Brandy Barker is a 26 y.o. female.  G6P3 Ab2 uncertain last menstrual period presents with vaginal discharge and minimal bleeding.  No prenatal care.  No dysuria, fever, sweats, chills.  Past medical history protein S deficiency, DVT, bacterial vaginosis.  Severity of symptoms is mild.  Nothing makes symptoms better or worse.      Past Medical History:  Diagnosis Date  . DVT (deep venous thrombosis) (HCC)   . Protein S deficiency (HCC) 2016    Patient Active Problem List   Diagnosis Date Noted  . History of DVT of lower extremity 06/25/2016  . Vaginal discharge 06/25/2016  . BV (bacterial vaginosis) 06/25/2016  . Encounter for gynecological examination with Papanicolaou smear of cervix 06/25/2016    Past Surgical History:  Procedure Laterality Date  . CESAREAN SECTION    . cesearan      OB History    Gravida Para Term Preterm AB Living   SAB TAB Ectopic Multiple Live Births   2       3       Home Medications    Prior to Admission medications   Medication Sig Start Date End Date Taking? Authorizing Provider  metroNIDAZOLE (FLAGYL) 500 MG tablet Take 1 tablet (500 mg total) by mouth 2 (two) times daily. 09/02/17   Donnetta Hutching, MD  valACYclovir (VALTREX) 1000 MG tablet Take 1 tablet (1,000 mg total) by mouth 3 (three) times daily for 14 days. 09/02/17 09/16/17  Donnetta Hutching, MD    Family History Family History  Problem Relation Age of Onset  . Cancer Paternal Grandfather   . Diabetes Paternal Grandmother   . Cancer Paternal Grandmother   . Hypertension Father   . Other Sister        DVT    Social History Social History   Tobacco Use  . Smoking status: Never Smoker  . Smokeless tobacco: Never Used  Substance Use Topics  . Alcohol use:  No  . Drug use: No     Allergies   Patient has no known allergies.   Review of Systems Review of Systems  All other systems reviewed and are negative.    Physical Exam Updated Vital Signs BP 110/72 (BP Location: Left Arm)   Pulse 83   Temp 98.2 F (36.8 C) (Oral)   Resp 18   Ht  (1.6 m)   Wt 82.1 kg (181 lb)   LMP 07/16/2017   SpO2 100%   BMI 32.06 kg/m   Physical Exam  Constitutional: She is oriented to person, place, and time. She appears well-developed and well-nourished.  HENT:  Head: Normocephalic and atraumatic.  Eyes: Conjunctivae are normal.  Neck: Neck supple.  Cardiovascular: Normal rate and regular rhythm.  Pulmonary/Chest: Effort normal and breath sounds normal.  Abdominal: Soft. Bowel sounds are normal.  Genitourinary:  Genitourinary Comments: Pelvic exam: 2 ulcerations on the inferior introitus approximately 4 mm in diameter.  Copious white vaginal discharge.  No obvious cervical motion tenderness.  Musculoskeletal: Normal range of motion.  Neurological: She is alert and oriented to person, place, and time.  Skin: Skin is warm and dry.  Psychiatric: She has a normal mood and affect. Her behavior is normal.  Nursing  note and vitals reviewed.    ED Treatments / Results  Labs (all labs ordered are listed, but only abnormal results are displayed) Labs Reviewed  WET PREP, GENITAL - Abnormal; Notable for the following components:      Result Value   Clue Cells Wet Prep HPF POC PRESENT (*)    WBC, Wet Prep HPF POC MANY (*)    All other components within normal limits  HCG, QUANTITATIVE, PREGNANCY - Abnormal; Notable for the following components:   hCG, Beta Chain, Quant, S 81,191 (*)    All other components within normal limits  CBC WITH DIFFERENTIAL/PLATELET - Abnormal; Notable for the following components:   MCH 25.8 (*)    All other components within normal limits  BASIC METABOLIC PANEL - Abnormal; Notable for the following components:    Sodium 133 (*)    Potassium 3.4 (*)    Glucose, Bld 123 (*)    Calcium 8.4 (*)    All other components within normal limits  URINALYSIS, ROUTINE W REFLEX MICROSCOPIC - Abnormal; Notable for the following components:   APPearance HAZY (*)    Leukocytes, UA TRACE (*)    Squamous Epithelial / LPF 0-5 (*)    All other components within normal limits  HSV CULTURE AND TYPING  ABO/RH  GC/CHLAMYDIA PROBE AMP ([place]) NOT AT [facility]    EKG  EKG Interpretation None       Radiology US Ob Comp < 14 Wks  Result Date: 09/02/2017 CLINICAL DATA:  Pregnant. Vaginal bleeding. Estimated gestational age by last menstrual period equals 6 weeks 6 days. EXAM: OBSTETRIC <14 WK [place] AND TRANSVAGINAL OB TECHNIQUE: Both transabdominal and transvaginal ultrasound examinations were performed for complete evaluation of the gestation as well as the maternal uterus, adnexal regions, and pelvic cul-de-sac. Transvaginal technique was performed to assess early pregnancy. Color and duplex Doppler ultrasound was utilized to evaluate blood flow to the ovaries. COMPARISON:  None. FINDINGS: Intrauterine gestational sac: Present Yolk sac:  Present Embryo:  Present Cardiac Activity: Present Heart Rate: 150 bpm MSD:   mm    w     d CRL:   9.5  mm   7 w 0 d                  [place] EDC: 04/21/2018 Subchorionic hemorrhage:  Small subchronic hemorrhage. Maternal uterus/adnexae: Normal ovaries.  No free fluid IMPRESSION: 1. Single intrauterine gestation with embryo and normal cardiac activity. 2. Estimated gestational age by crown rump length equals 7 weeks 0 days. Electronically Signed   By: Genevive Bi M.D.   On: 09/02/2017 14:55   US Ob Transvaginal  Result Date: 09/02/2017 CLINICAL DATA:  Pregnant. Vaginal bleeding. Estimated gestational age by last menstrual period equals 6 weeks 6 days. EXAM: OBSTETRIC <14 WK [place] AND TRANSVAGINAL OB TECHNIQUE: Both transabdominal and transvaginal ultrasound examinations were performed for  complete evaluation of the gestation as well as the maternal uterus, adnexal regions, and pelvic cul-de-sac. Transvaginal technique was performed to assess early pregnancy. Color and duplex Doppler ultrasound was utilized to evaluate blood flow to the ovaries. COMPARISON:  None. FINDINGS: Intrauterine gestational sac: Present Yolk sac:  Present Embryo:  Present Cardiac Activity: Present Heart Rate: 150 bpm MSD:   mm    w     d CRL:   9.5  mm   7 w 0 d                  [place] EDC: 04/21/2018  Subchorionic hemorrhage:  Small subchronic hemorrhage. Maternal uterus/adnexae: Normal ovaries.  No free fluid IMPRESSION: 1. Single intrauterine gestation with embryo and normal cardiac activity. 2. Estimated gestational age by crown rump length equals 7 weeks 0 days. Electronically Signed   By: Genevive BiStewart  Edmunds M.D.   On: 09/02/2017 14:55    Procedures Procedures (including critical care time)  Medications Ordered in ED Medications - No data to display   Initial Impression / Assessment and Plan / ED Course  I have reviewed the triage vital signs and the nursing notes.  Pertinent labs & imaging results that were available during my care of the patient were reviewed by me and considered in my medical decision making (see chart for details).     Patient presents with vaginal discharge and questionable bleeding.  Ultrasound reveals a 7-week gestation intrauterine pregnancy.  Will treat for bacterial vaginosis and potential herpes simplex type II.  Herpes cultures pending.  This was discussed with the GYN doctor on call and the patient.  She will get ob gyn follow-up.  Discharge medications metronidazole 500 mg and Valtrex 1000 mg  Final Clinical Impressions(s) / ED Diagnoses   Final diagnoses:  BV (bacterial vaginosis)    ED Discharge Orders        Ordered    metroNIDAZOLE (FLAGYL) 500 MG tablet  2 times daily     09/02/17 1954    valACYclovir (VALTREX) 1000 MG tablet  3 times daily     09/02/17 1954        Donnetta Hutchingook, Aradhya Shellenbarger, MD 09/02/17 2009

## 2017-09-02 NOTE — ED Notes (Signed)
Dr C in for pelvic with Tori witness  Spec to lab

## 2017-09-02 NOTE — Discharge Instructions (Signed)
Your tests suggested an infection called bacterial vaginosis.  Prescription for antibiotic.  Additionally, I am treating you for herpes although the culture has not come back yet.  You will be notified if the culture is positive.  Your sexual partner should be notified.  You will need OB/GYN follow-up.

## 2017-09-02 NOTE — ED Triage Notes (Signed)
Pt reports positive home pregnancy test on Dec 13th with vaginal swelling, pain, and irritation.  States began having vaginal bleeding on Thursday.  Bleeding intermittent and varies from spotting to period flow.

## 2017-09-02 NOTE — ED Notes (Signed)
Call to lab Re: wet prep

## 2017-09-04 LAB — GC/CHLAMYDIA PROBE AMP (~~LOC~~) NOT AT ARMC
CHLAMYDIA, DNA PROBE: NEGATIVE
Neisseria Gonorrhea: NEGATIVE

## 2017-09-04 LAB — HSV CULTURE AND TYPING

## 2017-11-19 ENCOUNTER — Encounter: Payer: Self-pay | Admitting: Obstetrics and Gynecology

## 2017-11-25 ENCOUNTER — Other Ambulatory Visit (HOSPITAL_COMMUNITY)
Admission: RE | Admit: 2017-11-25 | Discharge: 2017-11-25 | Disposition: A | Discharge status: Home / Self Care | Payer: Medicaid Other | Source: Ambulatory Visit | Attending: Obstetrics and Gynecology | Admitting: Obstetrics and Gynecology

## 2017-11-25 ENCOUNTER — Ambulatory Visit (INDEPENDENT_AMBULATORY_CARE_PROVIDER_SITE_OTHER): Payer: Medicaid Other | Admitting: Advanced Practice Midwife

## 2017-11-25 ENCOUNTER — Encounter: Payer: Self-pay | Admitting: Advanced Practice Midwife

## 2017-11-25 VITALS — BP 122/74 | HR 81 | Wt 194.6 lb

## 2017-11-25 DIAGNOSIS — Z3482 Encounter for supervision of other normal pregnancy, second trimester: Secondary | ICD-10-CM

## 2017-11-25 DIAGNOSIS — Z98891 History of uterine scar from previous surgery: Secondary | ICD-10-CM

## 2017-11-25 DIAGNOSIS — Z3490 Encounter for supervision of normal pregnancy, unspecified, unspecified trimester: Secondary | ICD-10-CM

## 2017-11-25 DIAGNOSIS — Z3492 Encounter for supervision of normal pregnancy, unspecified, second trimester: Secondary | ICD-10-CM | POA: Diagnosis not present

## 2017-11-25 DIAGNOSIS — Z86718 Personal history of other venous thrombosis and embolism: Secondary | ICD-10-CM | POA: Diagnosis present

## 2017-11-25 MED ORDER — ENOXAPARIN SODIUM 40 MG/0.4ML ~~LOC~~ SOLN: 40.0000 mg | SUBCUTANEOUS | 5 refills | Status: DC

## 2017-11-25 NOTE — Addendum Note (Signed)
Addended by: Aviva SignsWILLIAMS, Wende Longstreth L on: 11/25/2017 11:16 AM   Modules accepted: Kipp BroodSmartSet

## 2017-11-25 NOTE — Progress Notes (Signed)
  Subjective:    Brandy Barker is being seen today for her first obstetrical visit.  This is not a planned pregnancy. She is at 10453w6d gestation. Her obstetrical history is significant for History of DVT and Protein S Deficiency, Previous C/S x 3. Relationship with FOB: unknown, not asked. Patient does intend to breast feed. Pregnancy history fully reviewed.  Patient reports no complaints.  Review of Systems:   Review of Systems  Constitutional: Negative for appetite change, chills, fatigue and fever.  Respiratory: Negative for shortness of breath.   Cardiovascular: Negative for leg swelling.  Gastrointestinal: Negative for abdominal pain, constipation, diarrhea and nausea.  Genitourinary: Negative for difficulty urinating and pelvic pain.    Objective:     BP 122/74   Pulse 81   Wt 194 lb 9.6 oz (88.3 kg)   LMP 07/16/2017 (LMP Unknown)   BMI 34.47 kg/m  Physical Exam  Constitutional: She is oriented to person, place, and time. She appears well-developed and well-nourished. No distress.  HENT:  Head: Normocephalic.  Cardiovascular: Normal rate and regular rhythm.  Respiratory: Effort normal. No respiratory distress.  GI: Soft. She exhibits no distension. There is no tenderness. There is no rebound and no guarding.  Abdomen gravid, c/w dates  Genitourinary:  Genitourinary Comments: Cervix long and closed   Musculoskeletal: Normal range of motion.  Neurological: She is alert and oriented to person, place, and time.  Skin: Skin is warm and dry.  Psychiatric: She has a normal mood and affect.    Maternal Exam:  Abdomen: Surgical scars: low transverse.   Fundal height is 19.    Introitus: Normal vulva. Normal vagina.    Fetal Exam Fetal Monitor Review: Mode: hand-held doppler probe.   Baseline rate: 138.         Assessment:    Pregnancy: [id number] Patient Active Problem List   Diagnosis Date Noted  . Postphlebitic syndrome 10/27/2015  . Insertion of Nexplanon  12/08/2013  . Pregnancy 09/25/2013  . Cesarean delivery delivered 09/23/2013  . History of DVT (deep vein thrombosis) 03/03/2013       Plan:     Initial labs drawn. Prenatal vitamins. Problem list reviewed and updated. AFP3 discussed: Panorama ordered. Role of ultrasound in pregnancy discussed; fetal survey: ordered. Amniocentesis discussed: not indicated. Follow up in 4 weeks. 50% of 30 min visit spent on counseling and coordination of care.   Routines reviewed Ashby DawesNature of practice and providers reviewed    Wynelle BourgeoisMarie Matti Minney 11/25/2017

## 2017-11-25 NOTE — Progress Notes (Signed)
   Note in error,  Unable to delete note Aviva SignsWilliams, Zanita Millman L, CNM

## 2017-11-25 NOTE — Progress Notes (Incomplete)
PRENATAL VISIT NOTE  Subjective:  Brandy Barker is a 27 y.o. [phone] at [redacted]w[redacted]d being seen today for ongoing prenatal care.  She is currently monitored for the following issues for this {Blank single:19197::"high-risk","low-risk"} pregnancy and has History of DVT (deep vein thrombosis); Insertion of Nexplanon; Pregnancy; Cesarean delivery delivered; and Postphlebitic syndrome on their problem list.  Patient reports {sx:14538}.  Contractions: Not present. Vag. Bleeding: None.  Movement: Present. Denies leaking of fluid.   The following portions of the patient's history were reviewed and updated as appropriate: allergies, current medications, past family history, past medical history, past social history, past surgical history and problem list. Problem list updated.  Objective:   Vitals:   11/25/17 0836  BP: 122/74  Pulse: 81  Weight: 194 lb 9.6 oz (88.3 kg)    Fetal Status: Fetal Heart Rate (bpm): 138 Fundal Height: 19 cm Movement: Present  Presentation: Undeterminable  General:  Alert, oriented and cooperative. Patient is in no acute distress.  Skin: Skin is warm and dry. No rash noted.   Cardiovascular: Normal heart rate noted  Respiratory: Normal respiratory effort, no problems with respiration noted  Abdomen: Soft, gravid, appropriate for gestational age.  Pain/Pressure: Absent     Pelvic: {Blank single:19197::"Cervical exam performed","Cervical exam deferred"} Dilation: Closed Effacement (%): Thick Station: Ballotable  Extremities: Normal range of motion.  Edema: Trace  Mental Status:  Normal mood and affect. Normal behavior. Normal judgment and thought content.   Assessment and Plan:  Pregnancy: [id number] at [redacted]w[redacted]d  1. Encounter for supervision of normal pregnancy, antepartum, unspecified gravidity *** - Obstetric Panel, Including HIV - Hemoglobinopathy evaluation - Cystic Fibrosis Mutation 97 - Culture, OB Urine - Cervicovaginal ancillary only - Cytology - PAP - Genetic  Screening - Enroll Patient in Babyscripts - Flu Vaccine QUAD 36+ mos IM (Fluarix, Quad PF) - [place] MFM OB DETAIL +14 WK; Future  2. History of DVT of lower extremity *** - Obstetric Panel, Including HIV - Hemoglobinopathy evaluation - Cystic Fibrosis Mutation 97 - Culture, OB Urine - Cervicovaginal ancillary only - Cytology - PAP - Genetic Screening - Enroll Patient in Babyscripts - Flu Vaccine QUAD 36+ mos IM (Fluarix, Quad PF) - [place] MFM OB DETAIL +14 WK; Future  3. History of 3 cesarean sections ***  {Blank single:19197::"Term","Preterm"} labor symptoms and general obstetric precautions including but not limited to vaginal bleeding, contractions, leaking of fluid and fetal movement were reviewed in detail with the patient. Please refer to After Visit Summary for other counseling recommendations.  Return in about 1 month (around 12/23/2017) for [facility].   Wynelle Bourgeois, CNM Subjective:    Brandy Barker is a [id number] [redacted]w[redacted]d being seen today for her first obstetrical visit.  Her obstetrical history is significant for {ob risk factors:10154}. Patient {does/does not:19097} intend to breast feed. Pregnancy history fully reviewed.  Patient reports {sx:14538}.  Vitals:   11/25/17 0836  BP: 122/74  Pulse: 81  Weight: 194 lb 9.6 oz (88.3 kg)    HISTORY: OB History  Gravida Para Term Preterm AB Living  6 3 3   2 3   SAB TAB Ectopic Multiple Live Births  2       3    # Outcome Date GA Lbr Len/2nd Weight Sex Delivery Anes PTL Lv  6 Current           5 Term 09/22/13 [redacted]w[redacted]d  8 lb 9 oz (3.884 kg) M CS-LTranv   LIV  4 SAB 2013  3 Term 04/02/11 3986w0d  6 lb 5 oz (2.863 kg) M CS-LTranv   LIV  2 SAB 2011          1 Term 09/09/08 387w0d  7 lb 3 oz (3.26 kg) F CS-LTranv   LIV   Past Medical History:  Diagnosis Date  . DVT (deep venous thrombosis) (HCC)   . Protein S deficiency (HCC) 2016   Past Surgical History:  Procedure Laterality Date  . CESAREAN SECTION    . cesearan      Family History  Problem Relation Age of Onset  . Cancer Paternal Grandfather   . Diabetes Paternal Grandmother   . Cancer Paternal Grandmother   . Hypertension Father   . Other Sister        DVT     Exam    Uterus:  Fundal Height: 19 cm  Pelvic Exam:    Perineum: {Exam; anus and perineum:14598}   Vulva: {vulva exam:14487}   Vagina:  {vaginal exam:14498}   pH: ***   Cervix: {cervix exam:14595}   Adnexa: {adnexa:12223}   Bony Pelvis: {desc; pelvis:14491}  System: Breast:  {Exam; breast:13139::"normal appearance, no masses or tenderness"}   Skin: {pe skin brief ob:314459::"normal coloration and turgor, no rashes"}    Neurologic: {Exam; neuro:16375}   Extremities: {Exam; extremities:15096}   HEENT {exam; heent:31974}   Mouth/Teeth {pe mouth simple ob:314450::"mucous membranes moist, pharynx normal without lesions"}   Neck {Neck (ob):32092}   Cardiovascular: {HEART EXAM HEM/ONC:21750}   Respiratory:  {Exam; respiratory:5782::"appears well, vitals normal, no respiratory distress, acyanotic, normal RR","ear and throat exam is normal","neck free of mass or lymphadenopathy","chest clear, no wheezing, crepitations, rhonchi, normal symmetric air entry"}   Abdomen: {Exam; abdomen:16834}   Urinary: {exam; urinary female:30845}      Assessment:    Pregnancy: [id number] Patient Active Problem List   Diagnosis Date Noted  . Postphlebitic syndrome 10/27/2015  . Insertion of Nexplanon 12/08/2013  . Pregnancy 09/25/2013  . Cesarean delivery delivered 09/23/2013  . History of DVT (deep vein thrombosis) 03/03/2013        Plan:     Initial labs drawn. Prenatal vitamins. Problem list reviewed and updated. Genetic Screening discussed {GENETIC SCREENING TEST:22046}: {requests/ordered/declines:14581}.  Ultrasound discussed; fetal survey: {requests/ordered/declines:14581}.  Follow up in {numbers 0-4:31231} weeks. ***% of *** min visit spent on counseling and coordination of care.   ***   Wynelle BourgeoisMarie Zackari Ruane 11/25/2017

## 2017-11-25 NOTE — Patient Instructions (Signed)
Second Trimester of Pregnancy The second trimester is from week 13 through week 28, month 4 through 6. This is often the time in pregnancy that you feel your best. Often times, morning sickness has lessened or quit. You may have more energy, and you may get hungry more often. Your unborn baby (fetus) is growing rapidly. At the end of the sixth month, he or she is about 9 inches long and weighs about 1 pounds. You will likely feel the baby move (quickening) between 18 and 20 weeks of pregnancy. Follow these instructions at home:  Avoid all smoking, herbs, and alcohol. Avoid drugs not approved by your doctor.  Do not use any tobacco products, including cigarettes, chewing tobacco, and electronic cigarettes. If you need help quitting, ask your doctor. You may get counseling or other support to help you quit.  Only take medicine as told by your doctor. Some medicines are safe and some are not during pregnancy.  Exercise only as told by your doctor. Stop exercising if you start having cramps.  Eat regular, healthy meals.  Wear a good support bra if your breasts are tender.  Do not use hot tubs, steam rooms, or saunas.  Wear your seat belt when driving.  Avoid raw meat, uncooked cheese, and liter boxes and soil used by cats.  Take your prenatal vitamins.  Take 1500-2000 milligrams of calcium daily starting at the 20th week of pregnancy until you deliver your baby.  Try taking medicine that helps you poop (stool softener) as needed, and if your doctor approves. Eat more fiber by eating fresh fruit, vegetables, and whole grains. Drink enough fluids to keep your pee (urine) clear or pale yellow.  Take warm water baths (sitz baths) to soothe pain or discomfort caused by hemorrhoids. Use hemorrhoid cream if your doctor approves.  If you have puffy, bulging veins (varicose veins), wear support hose. Raise (elevate) your feet for 15 minutes, 3-4 times a day. Limit salt in your diet.  Avoid heavy  lifting, wear low heals, and sit up straight.  Rest with your legs raised if you have leg cramps or low back pain.  Visit your dentist if you have not gone during your pregnancy. Use a soft toothbrush to brush your teeth. Be gentle when you floss.  You can have sex (intercourse) unless your doctor tells you not to.  Go to your doctor visits. Get help if:  You feel dizzy.  You have mild cramps or pressure in your lower belly (abdomen).  You have a nagging pain in your belly area.  You continue to feel sick to your stomach (nauseous), throw up (vomit), or have watery poop (diarrhea).  You have bad smelling fluid coming from your vagina.  You have pain with peeing (urination). Get help right away if:  You have a fever.  You are leaking fluid from your vagina.  You have spotting or bleeding from your vagina.  You have severe belly cramping or pain.  You lose or gain weight rapidly.  You have trouble catching your breath and have chest pain.  You notice sudden or extreme puffiness (swelling) of your face, hands, ankles, feet, or legs.  You have not felt the baby move in over an hour.  You have severe headaches that do not go away with medicine.  You have vision changes. This information is not intended to replace advice given to you by your health care provider. Make sure you discuss any questions you have with your health care   provider. Document Released: 11/14/2009 Document Revised: 01/26/2016 Document Reviewed: 10/21/2012 Elsevier Interactive Patient Education  2017 [employer]  

## 2017-11-25 NOTE — Progress Notes (Signed)
  Note created in error by epic Unable to delete it Brandy Barker, Brandy Barker, CNM

## 2017-11-25 NOTE — Progress Notes (Signed)
NOB currently not taking Rx for DVT anymore.

## 2017-11-26 LAB — CERVICOVAGINAL ANCILLARY ONLY
BACTERIAL VAGINITIS: POSITIVE — AB
Candida vaginitis: POSITIVE — AB
Chlamydia: NEGATIVE
NEISSERIA GONORRHEA: NEGATIVE
TRICH (WINDOWPATH): NEGATIVE

## 2017-11-27 LAB — CYTOLOGY - PAP
ADEQUACY: ABSENT
DIAGNOSIS: NEGATIVE

## 2017-11-28 ENCOUNTER — Telehealth: Payer: Self-pay

## 2017-11-28 ENCOUNTER — Other Ambulatory Visit: Payer: Self-pay | Admitting: Advanced Practice Midwife

## 2017-11-28 LAB — CULTURE, OB URINE

## 2017-11-28 LAB — URINE CULTURE, OB REFLEX

## 2017-11-28 MED ORDER — METRONIDAZOLE 500 MG PO TABS: 500.0000 mg | ORAL_TABLET | Freq: Two times a day (BID) | ORAL | 0 refills | Status: DC

## 2017-11-28 MED ORDER — FLUCONAZOLE 150 MG PO TABS: 150.0000 mg | ORAL_TABLET | Freq: Once | ORAL | 0 refills | Status: AC

## 2017-11-28 NOTE — Telephone Encounter (Signed)
Attempted to contact about results and rx sent, left vm 

## 2017-11-28 NOTE — Progress Notes (Signed)
Testing + for BV and ;yeast Rx put in for both

## 2017-11-29 ENCOUNTER — Telehealth: Payer: Self-pay | Admitting: *Deleted

## 2017-11-29 ENCOUNTER — Encounter (HOSPITAL_COMMUNITY): Payer: Self-pay

## 2017-11-29 ENCOUNTER — Other Ambulatory Visit: Payer: Self-pay | Admitting: Advanced Practice Midwife

## 2017-11-29 ENCOUNTER — Telehealth: Payer: Self-pay

## 2017-11-29 ENCOUNTER — Ambulatory Visit (HOSPITAL_COMMUNITY)
Admission: RE | Admit: 2017-11-29 | Discharge: 2017-11-29 | Disposition: A | Discharge status: Home / Self Care | Payer: Medicaid Other | Source: Ambulatory Visit | Attending: Advanced Practice Midwife | Admitting: Advanced Practice Midwife

## 2017-11-29 DIAGNOSIS — Z86718 Personal history of other venous thrombosis and embolism: Secondary | ICD-10-CM | POA: Diagnosis not present

## 2017-11-29 DIAGNOSIS — O34219 Maternal care for unspecified type scar from previous cesarean delivery: Secondary | ICD-10-CM | POA: Diagnosis present

## 2017-11-29 DIAGNOSIS — Z3490 Encounter for supervision of normal pregnancy, unspecified, unspecified trimester: Secondary | ICD-10-CM

## 2017-11-29 DIAGNOSIS — O99212 Obesity complicating pregnancy, second trimester: Secondary | ICD-10-CM | POA: Insufficient documentation

## 2017-11-29 DIAGNOSIS — Z3689 Encounter for other specified antenatal screening: Secondary | ICD-10-CM | POA: Diagnosis not present

## 2017-11-29 DIAGNOSIS — Z3A19 19 weeks gestation of pregnancy: Secondary | ICD-10-CM | POA: Diagnosis not present

## 2017-11-29 LAB — CYSTIC FIBROSIS MUTATION 97: Interpretation: NOT DETECTED

## 2017-11-29 LAB — AFP, SERUM, OPEN SPINA BIFIDA
AFP MOM: 0.85
AFP Value: 37.9 ng/mL
GEST. AGE ON COLLECTION DATE: 18.9 wk
MATERNAL AGE AT EDD: 28.8 a
OSBR Risk 1 IN: 10000
TEST RESULTS AFP: NEGATIVE
Weight: 194 [lb_av]

## 2017-11-29 LAB — OBSTETRIC PANEL, INCLUDING HIV
Antibody Screen: NEGATIVE
BASOS: 0 %
Basophils Absolute: 0 10*3/uL (ref 0.0–0.2)
EOS (ABSOLUTE): 0.3 10*3/uL (ref 0.0–0.4)
EOS: 6 %
HEMATOCRIT: 37.8 % (ref 34.0–46.6)
HEMOGLOBIN: 12.1 g/dL (ref 11.1–15.9)
HIV SCREEN 4TH GENERATION: NONREACTIVE
Hepatitis B Surface Ag: NEGATIVE
IMMATURE GRANS (ABS): 0 10*3/uL (ref 0.0–0.1)
Immature Granulocytes: 0 %
LYMPHS: 31 %
Lymphocytes Absolute: 1.6 10*3/uL (ref 0.7–3.1)
MCH: 26.5 pg — ABNORMAL LOW (ref 26.6–33.0)
MCHC: 32 g/dL (ref 31.5–35.7)
MCV: 83 fL (ref 79–97)
MONOCYTES: 12 %
Monocytes Absolute: 0.6 10*3/uL (ref 0.1–0.9)
NEUTROS ABS: 2.6 10*3/uL (ref 1.4–7.0)
Neutrophils: 51 %
Platelets: 263 10*3/uL (ref 150–379)
RBC: 4.56 x10E6/uL (ref 3.77–5.28)
RDW: 16.1 % — AB (ref 12.3–15.4)
RH TYPE: POSITIVE
RPR Ser Ql: NONREACTIVE
RUBELLA: 2.57 {index} (ref >0.99)
WBC: 5.1 10*3/uL (ref 3.4–10.8)

## 2017-11-29 LAB — HEMOGLOBINOPATHY EVALUATION
HEMOGLOBIN A2 QUANTITATION: 2.5 % (ref 1.8–3.2)
HGB C: 0 %
HGB S: 0 %
HGB VARIANT: 0 %
Hemoglobin F Quantitation: 0 % (ref 0.0–2.0)
Hgb A: 97.5 % (ref 96.4–98.8)

## 2017-11-29 LAB — SPECIMEN STATUS REPORT

## 2017-11-29 MED ORDER — AMPICILLIN 500 MG PO CAPS: 500.0000 mg | ORAL_CAPSULE | Freq: Three times a day (TID) | ORAL | 0 refills | Status: DC

## 2017-11-29 MED ORDER — AMPICILLIN 250 MG PO CAPS: 250.0000 mg | ORAL_CAPSULE | Freq: Four times a day (QID) | ORAL | 0 refills | Status: DC

## 2017-11-29 NOTE — Progress Notes (Signed)
Strep A in urine Rx Ampicillin

## 2017-11-29 NOTE — Telephone Encounter (Signed)
[facility] stating that Rx for Ampicillin 250 mg capsules are not covered by Medicaid. However Ampicillin 500 mg capsules are covered. Consult with Nolene Bernheimerri Burleson, NP and new Rx received. Verbal Rx given to pharmacist for Ampicillin 500 mg - 1 capsule TID x7 days.

## 2017-11-29 NOTE — Addendum Note (Signed)
Encounter addended by: Drue NovelKeatts, Mosella Kasa J, RDMS on: 11/29/2017 3:51 PM  Actions taken: Imaging Exam ended

## 2017-11-29 NOTE — Telephone Encounter (Signed)
Notified pt about results and rx

## 2017-12-02 ENCOUNTER — Other Ambulatory Visit (HOSPITAL_COMMUNITY): Payer: Self-pay | Admitting: *Deleted

## 2017-12-02 DIAGNOSIS — Z362 Encounter for other antenatal screening follow-up: Secondary | ICD-10-CM

## 2017-12-23 ENCOUNTER — Encounter: Payer: Self-pay | Admitting: Women's Health

## 2017-12-23 ENCOUNTER — Ambulatory Visit (INDEPENDENT_AMBULATORY_CARE_PROVIDER_SITE_OTHER): Payer: Medicaid Other | Admitting: Women's Health

## 2017-12-23 VITALS — BP 120/80 | HR 97 | Wt 198.3 lb

## 2017-12-23 DIAGNOSIS — Z3A22 22 weeks gestation of pregnancy: Secondary | ICD-10-CM

## 2017-12-23 DIAGNOSIS — Z86718 Personal history of other venous thrombosis and embolism: Secondary | ICD-10-CM

## 2017-12-23 DIAGNOSIS — O34219 Maternal care for unspecified type scar from previous cesarean delivery: Secondary | ICD-10-CM

## 2017-12-23 DIAGNOSIS — I82501 Chronic embolism and thrombosis of unspecified deep veins of right lower extremity: Secondary | ICD-10-CM

## 2017-12-23 DIAGNOSIS — O2232 Deep phlebothrombosis in pregnancy, second trimester: Secondary | ICD-10-CM

## 2017-12-23 DIAGNOSIS — Z98891 History of uterine scar from previous surgery: Secondary | ICD-10-CM

## 2017-12-23 DIAGNOSIS — O0992 Supervision of high risk pregnancy, unspecified, second trimester: Secondary | ICD-10-CM

## 2017-12-23 DIAGNOSIS — O26842 Uterine size-date discrepancy, second trimester: Secondary | ICD-10-CM

## 2017-12-23 DIAGNOSIS — Z1389 Encounter for screening for other disorder: Secondary | ICD-10-CM

## 2017-12-23 DIAGNOSIS — Z331 Pregnant state, incidental: Secondary | ICD-10-CM

## 2017-12-23 DIAGNOSIS — O0990 Supervision of high risk pregnancy, unspecified, unspecified trimester: Secondary | ICD-10-CM

## 2017-12-23 LAB — POCT URINALYSIS DIPSTICK
Glucose, UA: NEGATIVE
Ketones, UA: NEGATIVE
LEUKOCYTES UA: NEGATIVE
NITRITE UA: NEGATIVE
RBC UA: NEGATIVE

## 2017-12-23 NOTE — Patient Instructions (Signed)
Brandy ForehandAriel Pollok, I greatly value your feedback.  If you receive a survey following your visit with us today, we appreciate you taking the time to fill it out.  Thanks, Joellyn HaffKim Ritisha Deitrick, CNM, WHNP-BC   You will have your sugar test next visit.  Please do not eat or drink anything after midnight the night before you come, not even water.  You will be here for at least two hours.     Call the office [phone]) or go to [facility] if:  You begin to have strong, frequent contractions  Your water breaks.  Sometimes it is a big gush of fluid, sometimes it is just a trickle that keeps getting your panties wet or running down your legs  You have vaginal bleeding.  It is normal to have a small amount of spotting if your cervix was checked.   You don't feel your baby moving like normal.  If you don't, get you something to eat and drink and lay down and focus on feeling your baby move.   If your baby is still not moving like normal, you should call the office or [facility] to [facility].  Second Trimester of Pregnancy The second trimester is from week 13 through week 28, months 4 through 6. The second trimester is often a time when you feel your best. Your body has also adjusted to being pregnant, and you begin to feel better physically. Usually, morning sickness has lessened or quit completely, you may have more energy, and you may have an increase in appetite. The second trimester is also a time when the fetus is growing rapidly. At the end of the sixth month, the fetus is about 9 inches long and weighs about 1 pounds. You will likely begin to feel the baby move (quickening) between 18 and 20 weeks of the pregnancy. BODY CHANGES Your body goes through many changes during pregnancy. The changes vary from woman to woman.   Your weight will continue to increase. You will notice your lower abdomen bulging out.  You may begin to get stretch marks on your hips, abdomen, and breasts.  You may develop headaches  that can be relieved by medicines approved by your health care provider.  You may urinate more often because the fetus is pressing on your bladder.  You may develop or continue to have heartburn as a result of your pregnancy.  You may develop constipation because certain hormones are causing the muscles that push waste through your intestines to slow down.  You may develop hemorrhoids or swollen, bulging veins (varicose veins).  You may have back pain because of the weight gain and pregnancy hormones relaxing your joints between the bones in your pelvis and as a result of a shift in weight and the muscles that support your balance.  Your breasts will continue to grow and be tender.  Your gums may bleed and may be sensitive to brushing and flossing.  Dark spots or blotches (chloasma, mask of pregnancy) may develop on your face. This will likely fade after the baby is born.  A dark line from your belly button to the pubic area (linea nigra) may appear. This will likely fade after the baby is born.  You may have changes in your hair. These can include thickening of your hair, rapid growth, and changes in texture. Some women also have hair loss during or after pregnancy, or hair that feels dry or thin. Your hair will most likely return to normal after your baby  is born. WHAT TO EXPECT AT YOUR PRENATAL VISITS During a routine prenatal visit:  You will be weighed to make sure you and the fetus are growing normally.  Your blood pressure will be taken.  Your abdomen will be measured to track your baby's growth.  The fetal heartbeat will be listened to.  Any test results from the previous visit will be discussed. Your health care provider may ask you:  How you are feeling.  If you are feeling the baby move.  If you have had any abnormal symptoms, such as leaking fluid, bleeding, severe headaches, or abdominal cramping.  If you have any questions. Other tests that may be performed  during your second trimester include:  Blood tests that check for:  Low iron levels (anemia).  Gestational diabetes (between 24 and 28 weeks).  Rh antibodies.  Urine tests to check for infections, diabetes, or protein in the urine.  An ultrasound to confirm the proper growth and development of the baby.  An amniocentesis to check for possible genetic problems.  Fetal screens for spina bifida and Down syndrome. HOME CARE INSTRUCTIONS   Avoid all smoking, herbs, alcohol, and unprescribed drugs. These chemicals affect the formation and growth of the baby.  Follow your health care provider's instructions regarding medicine use. There are medicines that are either safe or unsafe to take during pregnancy.  Exercise only as directed by your health care provider. Experiencing uterine cramps is a good sign to stop exercising.  Continue to eat regular, healthy meals.  Wear a good support bra for breast tenderness.  Do not use hot tubs, steam rooms, or saunas.  Wear your seat belt at all times when driving.  Avoid raw meat, uncooked cheese, cat litter boxes, and soil used by cats. These carry germs that can cause birth defects in the baby.  Take your prenatal vitamins.  Try taking a stool softener (if your health care provider approves) if you develop constipation. Eat more high-fiber foods, such as fresh vegetables or fruit and whole grains. Drink plenty of fluids to keep your urine clear or pale yellow.  Take warm sitz baths to soothe any pain or discomfort caused by hemorrhoids. Use hemorrhoid cream if your health care provider approves.  If you develop varicose veins, wear support hose. Elevate your feet for 15 minutes, 3-4 times a day. Limit salt in your diet.  Avoid heavy lifting, wear low heel shoes, and practice good posture.  Rest with your legs elevated if you have leg cramps or low back pain.  Visit your dentist if you have not gone yet during your pregnancy. Use a soft  toothbrush to brush your teeth and be gentle when you floss.  A sexual relationship may be continued unless your health care provider directs you otherwise.  Continue to go to all your prenatal visits as directed by your health care provider. SEEK MEDICAL CARE IF:   You have dizziness.  You have mild pelvic cramps, pelvic pressure, or nagging pain in the abdominal area.  You have persistent nausea, vomiting, or diarrhea.  You have a bad smelling vaginal discharge.  You have pain with urination. SEEK IMMEDIATE MEDICAL CARE IF:   You have a fever.  You are leaking fluid from your vagina.  You have spotting or bleeding from your vagina.  You have severe abdominal cramping or pain.  You have rapid weight gain or loss.  You have shortness of breath with chest pain.  You notice sudden or extreme swelling  of your face, hands, ankles, feet, or legs.  You have not felt your baby move in over an hour.  You have severe headaches that do not go away with medicine.  You have vision changes. Document Released: 08/14/2001 Document Revised: 08/25/2013 Document Reviewed: 10/21/2012 [facility] Patient Information 2015 [place], [place]. This information is not intended to replace advice given to you by your health care provider. Make sure you discuss any questions you have with your health care provider.

## 2017-12-23 NOTE — Progress Notes (Signed)
INITIAL OBSTETRICAL VISIT Patient name: Janeann Forehandriel Baetz MRN [id number]  Date of birth: 11/23/1990 Chief Complaint:   Initial Prenatal Visit  History of Present Illness:   Janeann Forehandriel Loud is a 27 y.o. [phone] African American female at 4169w6d by LMP c/w 7wk u/s, with an Estimated Date of Delivery: 04/22/18 being seen today for her initial obstetrical visit with us, she is transferring from [facility] for convenience, lives in [place].   Her obstetrical history is significant for term c/s x 3 (1st for FTP, then repeat x 2), SAB x 2. H/O chronic DVT, on Lovenox 40mg  daily.   Today she reports no complaints.  Patient's last menstrual period was 07/16/2017 (lmp unknown). Last pap 11/25/17. Results were: normal Review of Systems:   Pertinent items are noted in HPI Denies cramping/contractions, leakage of fluid, vaginal bleeding, abnormal vaginal discharge w/ itching/odor/irritation, headaches, visual changes, shortness of breath, chest pain, abdominal pain, severe nausea/vomiting, or problems with urination or bowel movements unless otherwise stated above.  Pertinent History Reviewed:  Reviewed past medical,surgical, social, obstetrical and family history.  Reviewed problem list, medications and allergies. OB History  Gravida Para Term Preterm AB Living  6 3 3   2 3   SAB TAB Ectopic Multiple Live Births  2       3    # Outcome Date GA Lbr Len/2nd Weight Sex Delivery Anes PTL Lv  6 Current           5 Term 09/22/13 34107w0d  8 lb 9 oz (3.884 kg) M CS-LTranv   LIV  4 SAB 2013          3 Term 04/02/11 59107w0d  6 lb 5 oz (2.863 kg) M CS-LTranv   LIV  2 SAB 2011          1 Term 09/09/08 2469w0d  7 lb 3 oz (3.26 kg) F CS-LTranv   LIV   Physical Assessment:   Vitals:   12/23/17 1600  BP: 120/80  Pulse: 97  Weight: 198 lb 4.8 oz (89.9 kg)  Body mass index is 35.13 kg/m.       Physical Examination:  General appearance - well appearing, and in no distress  Mental status - alert, oriented to person,  place, and time  Psych:  She has a normal mood and affect  Skin - warm and dry, normal color, no suspicious lesions noted  Chest - effort normal, all lung fields clear to auscultation bilaterally  Heart - normal rate and regular rhythm  Abdomen - soft, nontender  Extremities:  No swelling or varicosities noted  Thin prep pap is not done   Fetal Heart Rate (bpm): 143 via doppler FH: 30cm  Results for orders placed or performed in visit on 12/23/17 (from the past 24 hour(s))  POCT urinalysis dipstick   Collection Time: 12/23/17  4:11 PM  Result Value Ref Range   Color, UA     Clarity, UA     Glucose, UA neg    Bilirubin, UA     Ketones, UA neg    Spec Grav, UA  1.010 - 1.025   Blood, UA neg    pH, UA  5.0 - 8.0   Protein, UA trace    Urobilinogen, UA  0.2 or 1.0 E.U./dL   Nitrite, UA neg    Leukocytes, UA Negative Negative   Appearance     Odor      Assessment & Plan:  1) High-Risk Pregnancy [id number] at 3269w6d with an Estimated Date  of Delivery: 04/22/18   2) Initial OB visit  3) Chronic Rt DVT> since 2016, continue Lovenox 40mg  daily  4) Prev c/s x 3> for repeat  5) Uterine size > dates, will get efw/afi u/s  Meds: No orders of the defined types were placed in this encounter.  Continue prenatal vitamins Reviewed ptl s/s, fm Reviewed recommended weight gain based on pre-gravid BMI Encouraged well-balanced diet Genetic Screening discussed: neg cfDNA and AFP Cystic fibrosis screening discussed neg Ultrasound discussed; fetal survey: results reviewed CCNC completed previously  Follow-up: Return for asap for efw/afi u/s (no visit), then 4wks for HROB and PN2.   Orders Placed This Encounter  Procedures  . US OB Follow Up  . POCT urinalysis dipstick    Cheral Marker CNM, [facility] 12/23/2017 4:54 PM

## 2017-12-30 ENCOUNTER — Encounter: Payer: Self-pay | Admitting: Women's Health

## 2017-12-30 ENCOUNTER — Ambulatory Visit (INDEPENDENT_AMBULATORY_CARE_PROVIDER_SITE_OTHER): Payer: Medicaid Other

## 2017-12-30 ENCOUNTER — Other Ambulatory Visit: Payer: Self-pay | Admitting: Women's Health

## 2017-12-30 DIAGNOSIS — O26842 Uterine size-date discrepancy, second trimester: Secondary | ICD-10-CM

## 2017-12-30 DIAGNOSIS — O0992 Supervision of high risk pregnancy, unspecified, second trimester: Secondary | ICD-10-CM

## 2017-12-30 DIAGNOSIS — Z3A23 23 weeks gestation of pregnancy: Secondary | ICD-10-CM

## 2017-12-30 DIAGNOSIS — O402XX0 Polyhydramnios, second trimester, not applicable or unspecified: Secondary | ICD-10-CM | POA: Diagnosis not present

## 2017-12-30 DIAGNOSIS — O409XX0 Polyhydramnios, unspecified trimester, not applicable or unspecified: Secondary | ICD-10-CM | POA: Insufficient documentation

## 2017-12-30 NOTE — Progress Notes (Signed)
[place] 23+6 wks,cephalic,cx 5.2 cm,anterior pl gr 0,normal ovaries bilat,fhr 144 bpm,LEICF,AFI 25.7 cm,polyhydramnios,efw 649 g 47 %,anatomy complete,results discussed w/Kim.

## 2018-01-10 ENCOUNTER — Ambulatory Visit (HOSPITAL_COMMUNITY): Payer: Medicaid Other

## 2018-01-10 ENCOUNTER — Encounter (HOSPITAL_COMMUNITY): Payer: Self-pay

## 2018-01-17 ENCOUNTER — Other Ambulatory Visit: Payer: Self-pay | Admitting: Women's Health

## 2018-01-17 DIAGNOSIS — O409XX0 Polyhydramnios, unspecified trimester, not applicable or unspecified: Secondary | ICD-10-CM

## 2018-01-20 ENCOUNTER — Encounter: Payer: Self-pay | Admitting: Women's Health

## 2018-01-20 ENCOUNTER — Ambulatory Visit (INDEPENDENT_AMBULATORY_CARE_PROVIDER_SITE_OTHER): Payer: Medicaid Other

## 2018-01-20 ENCOUNTER — Encounter: Payer: Medicaid Other | Admitting: Women's Health

## 2018-01-20 ENCOUNTER — Other Ambulatory Visit: Payer: Medicaid Other

## 2018-01-20 ENCOUNTER — Ambulatory Visit (INDEPENDENT_AMBULATORY_CARE_PROVIDER_SITE_OTHER): Payer: Medicaid Other | Admitting: Women's Health

## 2018-01-20 VITALS — BP 126/74 | HR 80 | Wt 205.0 lb

## 2018-01-20 DIAGNOSIS — O409XX0 Polyhydramnios, unspecified trimester, not applicable or unspecified: Secondary | ICD-10-CM

## 2018-01-20 DIAGNOSIS — Z98891 History of uterine scar from previous surgery: Secondary | ICD-10-CM

## 2018-01-20 DIAGNOSIS — O403XX0 Polyhydramnios, third trimester, not applicable or unspecified: Secondary | ICD-10-CM | POA: Diagnosis not present

## 2018-01-20 DIAGNOSIS — Z3A26 26 weeks gestation of pregnancy: Secondary | ICD-10-CM

## 2018-01-20 DIAGNOSIS — O402XX0 Polyhydramnios, second trimester, not applicable or unspecified: Secondary | ICD-10-CM

## 2018-01-20 DIAGNOSIS — O34219 Maternal care for unspecified type scar from previous cesarean delivery: Secondary | ICD-10-CM

## 2018-01-20 DIAGNOSIS — Z1389 Encounter for screening for other disorder: Secondary | ICD-10-CM

## 2018-01-20 DIAGNOSIS — O0990 Supervision of high risk pregnancy, unspecified, unspecified trimester: Secondary | ICD-10-CM

## 2018-01-20 DIAGNOSIS — Z331 Pregnant state, incidental: Secondary | ICD-10-CM

## 2018-01-20 DIAGNOSIS — Z131 Encounter for screening for diabetes mellitus: Secondary | ICD-10-CM

## 2018-01-20 DIAGNOSIS — O0992 Supervision of high risk pregnancy, unspecified, second trimester: Secondary | ICD-10-CM

## 2018-01-20 DIAGNOSIS — Z86718 Personal history of other venous thrombosis and embolism: Secondary | ICD-10-CM

## 2018-01-20 LAB — POCT URINALYSIS DIPSTICK
Blood, UA: NEGATIVE
Glucose, UA: NEGATIVE
KETONES UA: NEGATIVE
Leukocytes, UA: NEGATIVE
Nitrite, UA: NEGATIVE

## 2018-01-20 NOTE — Patient Instructions (Signed)
Brandy Barker, I greatly value your feedback.  If you receive a survey following your visit with [place] today, we appreciate you taking the time to fill it out.  Thanks, Joellyn Haff, CNM, WHNP-BC   Call the office [phone]) or go to [facility] if:  You begin to have strong, frequent contractions  Your water breaks.  Sometimes it is a big gush of fluid, sometimes it is just a trickle that keeps getting your panties wet or running down your legs  You have vaginal bleeding.  It is normal to have a small amount of spotting if your cervix was checked.   You don't feel your baby moving like normal.  If you don't, get you something to eat and drink and lay down and focus on feeling your baby move.  You should feel at least 10 movements in 2 hours.  If you don't, you should call the office or go to [facility].    Tdap Vaccine  It is recommended that you get the Tdap vaccine during the third trimester of EACH pregnancy to help protect your baby from getting pertussis (whooping cough)  27-36 weeks is the BEST time to do this so that you can pass the protection on to your baby. During pregnancy is better than after pregnancy, but if you are unable to get it during pregnancy it will be offered at the hospital.   You can get this vaccine at the health department or your family doctor  Everyone who will be around your baby should also be up-to-date on their vaccines. Adults (who are not pregnant) only need 1 dose of Tdap during adulthood.   Third Trimester of Pregnancy The third trimester is from week 29 through week 42, months 7 through 9. The third trimester is a time when the fetus is growing rapidly. At the end of the ninth month, the fetus is about 20 inches in length and weighs 6-10 pounds.  BODY CHANGES Your body goes through many changes during pregnancy. The changes vary from woman to woman.   Your weight will continue to increase. You can expect to gain 25-35 pounds (11-16 kg) by the  end of the pregnancy.  You may begin to get stretch marks on your hips, abdomen, and breasts.  You may urinate more often because the fetus is moving lower into your pelvis and pressing on your bladder.  You may develop or continue to have heartburn as a result of your pregnancy.  You may develop constipation because certain hormones are causing the muscles that push waste through your intestines to slow down.  You may develop hemorrhoids or swollen, bulging veins (varicose veins).  You may have pelvic pain because of the weight gain and pregnancy hormones relaxing your joints between the bones in your pelvis. Backaches may result from overexertion of the muscles supporting your posture.  You may have changes in your hair. These can include thickening of your hair, rapid growth, and changes in texture. Some women also have hair loss during or after pregnancy, or hair that feels dry or thin. Your hair will most likely return to normal after your baby is born.  Your breasts will continue to grow and be tender. A yellow discharge may leak from your breasts called colostrum.  Your belly button may stick out.  You may feel short of breath because of your expanding uterus.  You may notice the fetus "dropping," or moving lower in your abdomen.  You may have a bloody mucus  discharge. This usually occurs a few days to a week before labor begins.  Your cervix becomes thin and soft (effaced) near your due date. WHAT TO EXPECT AT YOUR PRENATAL EXAMS  You will have prenatal exams every 2 weeks until week 36. Then, you will have weekly prenatal exams. During a routine prenatal visit:  You will be weighed to make sure you and the fetus are growing normally.  Your blood pressure is taken.  Your abdomen will be measured to track your baby's growth.  The fetal heartbeat will be listened to.  Any test results from the previous visit will be discussed.  You may have a cervical check near your due  date to see if you have effaced. At around 36 weeks, your caregiver will check your cervix. At the same time, your caregiver will also perform a test on the secretions of the vaginal tissue. This test is to determine if a type of bacteria, Group B streptococcus, is present. Your caregiver will explain this further. Your caregiver may ask you:  What your birth plan is.  How you are feeling.  If you are feeling the baby move.  If you have had any abnormal symptoms, such as leaking fluid, bleeding, severe headaches, or abdominal cramping.  If you have any questions. Other tests or screenings that may be performed during your third trimester include:  Blood tests that check for low iron levels (anemia).  Fetal testing to check the health, activity level, and growth of the fetus. Testing is done if you have certain medical conditions or if there are problems during the pregnancy. FALSE LABOR You may feel small, irregular contractions that eventually go away. These are called Braxton Hicks contractions, or false labor. Contractions may last for hours, days, or even weeks before true labor sets in. If contractions come at regular intervals, intensify, or become painful, it is best to be seen by your caregiver.  SIGNS OF LABOR   Menstrual-like cramps.  Contractions that are 5 minutes apart or less.  Contractions that start on the top of the uterus and spread down to the lower abdomen and back.  A sense of increased pelvic pressure or back pain.  A watery or bloody mucus discharge that comes from the vagina. If you have any of these signs before the 37th week of pregnancy, call your caregiver right away. You need to go to the hospital to get checked immediately. HOME CARE INSTRUCTIONS   Avoid all smoking, herbs, alcohol, and unprescribed drugs. These chemicals affect the formation and growth of the baby.  Follow your caregiver's instructions regarding medicine use. There are medicines that  are either safe or unsafe to take during pregnancy.  Exercise only as directed by your caregiver. Experiencing uterine cramps is a good sign to stop exercising.  Continue to eat regular, healthy meals.  Wear a good support bra for breast tenderness.  Do not use hot tubs, steam rooms, or saunas.  Wear your seat belt at all times when driving.  Avoid raw meat, uncooked cheese, cat litter boxes, and soil used by cats. These carry germs that can cause birth defects in the baby.  Take your prenatal vitamins.  Try taking a stool softener (if your caregiver approves) if you develop constipation. Eat more high-fiber foods, such as fresh vegetables or fruit and whole grains. Drink plenty of fluids to keep your urine clear or pale yellow.  Take warm sitz baths to soothe any pain or discomfort caused by hemorrhoids. Use  hemorrhoid cream if your caregiver approves.  If you develop varicose veins, wear support hose. Elevate your feet for 15 minutes, 3-4 times a day. Limit salt in your diet.  Avoid heavy lifting, wear low heal shoes, and practice good posture.  Rest a lot with your legs elevated if you have leg cramps or low back pain.  Visit your dentist if you have not gone during your pregnancy. Use a soft toothbrush to brush your teeth and be gentle when you floss.  A sexual relationship may be continued unless your caregiver directs you otherwise.  Do not travel far distances unless it is absolutely necessary and only with the approval of your caregiver.  Take prenatal classes to understand, practice, and ask questions about the labor and delivery.  Make a trial run to the hospital.  Pack your hospital bag.  Prepare the baby's nursery.  Continue to go to all your prenatal visits as directed by your caregiver. SEEK MEDICAL CARE IF:  You are unsure if you are in labor or if your water has broken.  You have dizziness.  You have mild pelvic cramps, pelvic pressure, or nagging pain  in your abdominal area.  You have persistent nausea, vomiting, or diarrhea.  You have a bad smelling vaginal discharge.  You have pain with urination. SEEK IMMEDIATE MEDICAL CARE IF:   You have a fever.  You are leaking fluid from your vagina.  You have spotting or bleeding from your vagina.  You have severe abdominal cramping or pain.  You have rapid weight loss or gain.  You have shortness of breath with chest pain.  You notice sudden or extreme swelling of your face, hands, ankles, feet, or legs.  You have not felt your baby move in over an hour.  You have severe headaches that do not go away with medicine.  You have vision changes. Document Released: 08/14/2001 Document Revised: 08/25/2013 Document Reviewed: 10/21/2012 [facility] Patient Information 2015 [place], [place]. This information is not intended to replace advice given to you by your health care provider. Make sure you discuss any questions you have with your health care provider.

## 2018-01-20 NOTE — Progress Notes (Signed)
[place] 26+6 wks,cephalic,cx 4.7 cm,polyhydramnios,afi 28 cm,anterior pl gr 0,normal ovaries bilat,fhr 146 bpm,efw 1075 g 61%,LVEICF not seen on today's ultrasound

## 2018-01-20 NOTE — Progress Notes (Signed)
HIGH-RISK PREGNANCY VISIT Patient name: Brandy Barker MRN [id number]  Date of birth: 07/08/1991 Chief Complaint:   Routine Prenatal Visit ([place] today)  History of Present Illness:   Brandy Barker is a 27 y.o. [phone] female at [redacted]w[redacted]d with an Estimated Date of Delivery: 04/22/18 being seen today for ongoing management of a high-risk pregnancy complicated by Polyhydramnios, h/o DVT currently on Lovenox  daily, prev c/s x 3.  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 Contractions: Not present. Vag. Bleeding: None.  Movement: Present. denies leaking of fluid.  Review of Systems:   Pertinent items are noted in HPI Denies abnormal vaginal discharge w/ itching/odor/irritation, headaches, visual changes, shortness of breath, chest pain, abdominal pain, severe nausea/vomiting, or problems with urination or bowel movements unless otherwise stated above. Pertinent History Reviewed:  Reviewed past medical,surgical, social, obstetrical and family history.  Reviewed problem list, medications and allergies. Physical Assessment:   Vitals:   01/20/18 1038  BP: 126/74  Pulse: 80  Weight: 205 lb (93 kg)  Body mass index is 36.31 kg/m.           Physical Examination:   General appearance: alert, well appearing, and in no distress  Mental status: alert, oriented to person, place, and time  Skin: warm & dry   Extremities: Edema: None    Cardiovascular: normal heart rate noted  Respiratory: normal respiratory effort, no distress  Abdomen: gravid, soft, non-tender  Pelvic: Cervical exam deferred         Fetal Status: Fetal Heart Rate (bpm): 146 u/s Fundal Height: 36 cm Movement: Present    Fetal Surveillance Testing today: [place] 26+6 wks,cephalic,cx 4.7 cm,polyhydramnios,afi 28 cm,anterior pl gr 0,normal ovaries bilat,fhr 146 bpm,efw 1075 g 61%,LVEICF not seen on today's ultrasound  Results for orders placed or performed in visit on 01/20/18 (from the past 24  hour(s))  POCT Urinalysis Dipstick   Collection Time: 01/20/18 10:41 AM  Result Value Ref Range   Color, UA     Clarity, UA     Glucose, UA neg    Bilirubin, UA     Ketones, UA neg    Spec Grav, UA  1.010 - 1.025   Blood, UA neg    pH, UA  5.0 - 8.0   Protein, UA trace    Urobilinogen, UA  0.2 or 1.0 E.U./dL   Nitrite, UA neg    Leukocytes, UA Negative Negative   Appearance     Odor      Assessment & Plan:  1) High-risk pregnancy [id number] at [redacted]w[redacted]d with an Estimated Date of Delivery: 04/22/18   2) Polyhdramnios, AFI 28cm today, doing 2hr GTT today  3) H/O DVT, continue Lovenox  daily  4) Prev c/s x 3> for RCS  5) CNA school, needs current vaccines> gave list of labs this pregnancy, getting tdap next week here at her appt, added varicella titer to today's labs. OK to get TB skin test  Meds: No orders of the defined types were placed in this encounter.   Labs/procedures today: u/s, pn2  Treatment Plan:  U/S @  31-32, 35-36wks   Weekly BPP @ 28wks, then 2x/wk testing @ 32wks or weekly BPP    Deliver @ 39wks:____   Reviewed: Preterm labor symptoms and general obstetric precautions including but not limited to vaginal bleeding, contractions, leaking of fluid and fetal movement were reviewed in detail with the patient. Recommended Tdap at HD/PCP per CDC guidelines.  All  questions were answered.  Follow-up: Return in about 8 days (around 01/28/2018) for HROB, US:BPP.  Orders Placed This Encounter  Procedures  . US FETAL BPP WO NON STRESS  . Varicella zoster antibody, IgG  . POCT Urinalysis Dipstick   Cheral Marker CNM, [facility] 01/20/2018 11:16 AM

## 2018-01-21 ENCOUNTER — Other Ambulatory Visit: Payer: Self-pay | Admitting: Women's Health

## 2018-01-21 LAB — ANTIBODY SCREEN: Antibody Screen: NEGATIVE

## 2018-01-21 LAB — CBC
Hematocrit: 30.8 % — ABNORMAL LOW (ref 34.0–46.6)
Hemoglobin: 9.9 g/dL — ABNORMAL LOW (ref 11.1–15.9)
MCH: 25.8 pg — AB (ref 26.6–33.0)
MCHC: 32.1 g/dL (ref 31.5–35.7)
MCV: 80 fL (ref 79–97)
PLATELETS: 253 10*3/uL (ref 150–450)
RBC: 3.84 x10E6/uL (ref 3.77–5.28)
RDW: 15.2 % (ref 12.3–15.4)
WBC: 5.7 10*3/uL (ref 3.4–10.8)

## 2018-01-21 LAB — RPR: RPR Ser Ql: NONREACTIVE

## 2018-01-21 LAB — HIV ANTIBODY (ROUTINE TESTING W REFLEX): HIV SCREEN 4TH GENERATION: NONREACTIVE

## 2018-01-21 LAB — VARICELLA ZOSTER ANTIBODY, IGG: Varicella zoster IgG: 2077 index (ref >165)

## 2018-01-21 LAB — GLUCOSE TOLERANCE, 2 HOURS W/ 1HR
Glucose, 1 hour: 141 mg/dL (ref 65–179)
Glucose, 2 hour: 106 mg/dL (ref 65–152)
Glucose, Fasting: 71 mg/dL (ref 65–91)

## 2018-01-24 ENCOUNTER — Other Ambulatory Visit: Payer: Self-pay | Admitting: Women's Health

## 2018-01-24 MED ORDER — FERROUS SULFATE 325 (65 FE) MG PO TABS: 325.0000 mg | ORAL_TABLET | Freq: Two times a day (BID) | ORAL | 3 refills | Status: DC

## 2018-01-29 ENCOUNTER — Ambulatory Visit (INDEPENDENT_AMBULATORY_CARE_PROVIDER_SITE_OTHER): Payer: Medicaid Other

## 2018-01-29 ENCOUNTER — Encounter: Payer: Self-pay | Admitting: Obstetrics and Gynecology

## 2018-01-29 ENCOUNTER — Ambulatory Visit (INDEPENDENT_AMBULATORY_CARE_PROVIDER_SITE_OTHER): Payer: Medicaid Other | Admitting: Obstetrics and Gynecology

## 2018-01-29 VITALS — BP 110/60 | HR 98 | Wt 204.0 lb

## 2018-01-29 DIAGNOSIS — Z86718 Personal history of other venous thrombosis and embolism: Secondary | ICD-10-CM

## 2018-01-29 DIAGNOSIS — Z3A28 28 weeks gestation of pregnancy: Secondary | ICD-10-CM

## 2018-01-29 DIAGNOSIS — O402XX0 Polyhydramnios, second trimester, not applicable or unspecified: Secondary | ICD-10-CM

## 2018-01-29 DIAGNOSIS — Z23 Encounter for immunization: Secondary | ICD-10-CM | POA: Diagnosis not present

## 2018-01-29 DIAGNOSIS — O403XX0 Polyhydramnios, third trimester, not applicable or unspecified: Secondary | ICD-10-CM | POA: Diagnosis not present

## 2018-01-29 DIAGNOSIS — Z331 Pregnant state, incidental: Secondary | ICD-10-CM

## 2018-01-29 DIAGNOSIS — O0992 Supervision of high risk pregnancy, unspecified, second trimester: Secondary | ICD-10-CM

## 2018-01-29 DIAGNOSIS — Z1389 Encounter for screening for other disorder: Secondary | ICD-10-CM

## 2018-01-29 LAB — POCT URINALYSIS DIPSTICK
Blood, UA: NEGATIVE
Glucose, UA: NEGATIVE
Ketones, UA: NEGATIVE
Leukocytes, UA: NEGATIVE
Nitrite, UA: NEGATIVE
Protein, UA: POSITIVE — AB

## 2018-01-29 NOTE — Patient Instructions (Signed)
You have been given TDAP immunization today.

## 2018-01-29 NOTE — Progress Notes (Addendum)
Patient ID: Brandy Barker, female   DOB: [birth year], 27 y.o.   MRN: [id number]    [facility] PREGNANCY VISIT Patient name: Brandy Barker MRN [id number]  Date of birth: 01-Mar-1991 Chief Complaint:   high risk ob (ultrasound)  History of Present Illness:   Brandy Barker is a 27 y.o. [id number] female at [redacted]w[redacted]d with an Estimated Date of Delivery: 04/22/18 being seen today for ongoing management of a high-risk pregnancy complicated by Polyhydramnios, h/o DVT. She also needs info for school on her Varicella immunity and wants TDAP immunization. Today she reports no complaints. Contractions: Not present.  .  Movement: Present. denies leaking of fluid.  Review of Systems:   Pertinent items are noted in HPI Denies abnormal vaginal discharge w/ itching/odor/irritation, headaches, visual changes, shortness of breath, chest pain, abdominal pain, severe nausea/vomiting, or problems with urination or bowel movements unless otherwise stated above. Pertinent History Reviewed:  Reviewed past medical,surgical, social, obstetrical and family history.  Reviewed problem list, medications and allergies. Physical Assessment:   Vitals:   01/29/18 1515  BP: 110/60  Pulse: 98  Weight: 204 lb (92.5 kg)  Body mass index is 36.14 kg/m.           Physical Examination:   General appearance: alert, well appearing, and in no distress, oriented to person, place, and time and normal appearing weight  Mental status: alert, oriented to person, place, and time, normal mood, behavior, speech, dress, motor activity, and thought processes, affect appropriate to mood  Skin: warm & dry   Extremities: Edema: None    Cardiovascular: normal heart rate noted  Respiratory: normal respiratory effort, no distress  Abdomen: gravid, soft, non-tender  Pelvic: Cervical exam deferred         Fetal Status: Fetal Heart Rate (bpm): 142 u/s, 156 Doppler Fundal Height: 38 cm Movement: Present    Fetal Surveillance Testing today: [place] 28+1  wks,cephalic,cx 4.9 cm,anterior pl gr 1,AFI 29 cm (poly),LVEICF,FHR 142 bpm,BPP 8/8.  Results for orders placed or performed in visit on 01/29/18 (from the past 24 hour(s))  POCT urinalysis dipstick   Collection Time: 01/29/18  3:18 PM  Result Value Ref Range   Color, UA     Clarity, UA     Glucose, UA Negative Negative   Bilirubin, UA     Ketones, UA neg    Spec Grav, UA  1.010 - 1.025   Blood, UA neg    pH, UA  5.0 - 8.0   Protein, UA Positive (A) Negative   Urobilinogen, UA  0.2 or 1.0 E.U./dL   Nitrite, UA neg    Leukocytes, UA Negative Negative   Appearance     Odor      Assessment & Plan:  1) High-risk pregnancy [id number] at [redacted]w[redacted]d with an Estimated Date of Delivery: 04/22/18   2) Polyhdramnios, stable  3) H/O DVT due to hx Protein S deficiency.,  Right outer calf.stable, Rx Lovenox 40 mg/ 0.4 ML injection  Meds: No orders of the defined types were placed in this encounter.   Labs/procedures today: T-Dap, U/S  Treatment Plan: once a week testing BPP til 32 wk then twice weekly   Follow-up: Return in about 1 week (around 02/05/2018) for HROB, BPP.  Orders Placed This Encounter  Procedures  . POCT urinalysis dipstick   By signing my name below, I, Arnette Norris, attest that this documentation has been prepared under the direction and in the presence of Tilda Burrow, MD. Electronically Signed: Arnette Norris Medical Scribe. 01/29/18.  3:41 PM.

## 2018-01-29 NOTE — Progress Notes (Signed)
[place] 28+1 wks,cephalic,cx 4.9 cm,anterior pl gr 1,AFI 29 cm (poly),LVEICF,FHR 142 bpm,BPP 8/8

## 2018-01-30 ENCOUNTER — Telehealth: Payer: Self-pay | Admitting: Obstetrics & Gynecology

## 2018-01-31 NOTE — Telephone Encounter (Signed)
LMOVM that TDAP is the onlyl vaccine that is needed at this time.  If she needs a TB skin test for a job, that is fine but not done at our office. Also informed maybe her instructor was thinking about Rhogam which she does not need because she is A+. Advised to call back if she had further questions.

## 2018-02-03 ENCOUNTER — Telehealth: Payer: Self-pay | Admitting: *Deleted

## 2018-02-03 NOTE — Telephone Encounter (Signed)
Pt called asking if it was ok for her to receive Hepatitis B vaccine during pregnancy. Advised pt, per Dr Despina HiddenEure, that it was ok to receive the vaccine. Pt requests note stating this.

## 2018-02-04 ENCOUNTER — Other Ambulatory Visit: Payer: Self-pay | Admitting: Obstetrics and Gynecology

## 2018-02-04 DIAGNOSIS — O409XX0 Polyhydramnios, unspecified trimester, not applicable or unspecified: Secondary | ICD-10-CM

## 2018-02-05 ENCOUNTER — Ambulatory Visit (INDEPENDENT_AMBULATORY_CARE_PROVIDER_SITE_OTHER): Payer: Medicaid Other

## 2018-02-05 ENCOUNTER — Ambulatory Visit (INDEPENDENT_AMBULATORY_CARE_PROVIDER_SITE_OTHER): Payer: Medicaid Other | Admitting: Advanced Practice Midwife

## 2018-02-05 VITALS — BP 115/67 | HR 83 | Wt 208.0 lb

## 2018-02-05 DIAGNOSIS — O99013 Anemia complicating pregnancy, third trimester: Secondary | ICD-10-CM

## 2018-02-05 DIAGNOSIS — Z331 Pregnant state, incidental: Secondary | ICD-10-CM

## 2018-02-05 DIAGNOSIS — Z3A29 29 weeks gestation of pregnancy: Secondary | ICD-10-CM

## 2018-02-05 DIAGNOSIS — O403XX0 Polyhydramnios, third trimester, not applicable or unspecified: Secondary | ICD-10-CM

## 2018-02-05 DIAGNOSIS — O0990 Supervision of high risk pregnancy, unspecified, unspecified trimester: Secondary | ICD-10-CM

## 2018-02-05 DIAGNOSIS — O409XX0 Polyhydramnios, unspecified trimester, not applicable or unspecified: Secondary | ICD-10-CM

## 2018-02-05 DIAGNOSIS — D649 Anemia, unspecified: Secondary | ICD-10-CM

## 2018-02-05 DIAGNOSIS — Z1389 Encounter for screening for other disorder: Secondary | ICD-10-CM

## 2018-02-05 LAB — POCT URINALYSIS DIPSTICK
Glucose, UA: NEGATIVE
Ketones, UA: NEGATIVE
Leukocytes, UA: NEGATIVE
Nitrite, UA: NEGATIVE
Protein, UA: NEGATIVE
RBC UA: NEGATIVE

## 2018-02-05 NOTE — Progress Notes (Signed)
HIGH-RISK PREGNANCY VISIT Patient name: Brandy Barker MRN [id number]  Date of birth: 10/23/1990 Chief Complaint:   High Risk Gestation (US today)  History of Present Illness:   Brandy Forehandriel Rail is a 27 y.o. [phone] female at 4931w1d with an Estimated Date of Delivery: 04/22/18 being seen today for ongoing management of a high-risk pregnancy complicated by polyhydramnios.  Today she reports no complaints. Contractions: Not present. Vag. Bleeding: None.  Movement: Present. denies leaking of fluid.  Review of Systems:   Pertinent items are noted in HPI Denies abnormal vaginal discharge w/ itching/odor/irritation, headaches, visual changes, shortness of breath, chest pain, abdominal pain, severe nausea/vomiting, or problems with urination or bowel movements unless otherwise stated above.    Pertinent History Reviewed:  Medical & Surgical Hx:   Past Medical History:  Diagnosis Date  . DVT (deep venous thrombosis) (HCC)   . Protein S deficiency (HCC) 2016   Past Surgical History:  Procedure Laterality Date  . CESAREAN SECTION    . cesearan     Family History  Problem Relation Age of Onset  . Cancer Paternal Grandfather   . Diabetes Paternal Grandmother   . Cancer Paternal Grandmother   . Hypertension Father   . Other Sister        DVT    Current Outpatient Medications:  .  enoxaparin (LOVENOX) 40 MG/0.4ML injection, Inject 0.4 mLs (40 mg total) into the skin daily., Disp: 30 Syringe, Rfl: 5 .  ferrous sulfate 325 (65 FE) MG tablet, Take 1 tablet (325 mg total) by mouth 2 (two) times daily with a meal., Disp: 60 tablet, Rfl: 3 .  Prenatal Vit-Fe Fumarate-FA (MULTIVITAMIN-PRENATAL) 27-0.8 MG TABS tablet, Take 1 tablet by mouth daily at 12 noon., Disp: , Rfl:  Social History: Reviewed -  reports that she has never smoked. She has never used smokeless tobacco.   Physical Assessment:   Vitals:   02/05/18 1616  BP: 115/67  Pulse: 83  Weight: 208 lb (94.3 kg)  Body mass index is 36.85  kg/m.           Physical Examination:   General appearance: alert, well appearing, and in no distress  Mental status: alert, oriented to person, place, and time  Skin: warm & dry   Extremities: Edema: None    Cardiovascular: normal heart rate noted  Respiratory: normal respiratory effort, no distress  Abdomen: gravid, soft, non-tender  Pelvic: Cervical exam deferred         Fetal Status:     Movement: Present    Fetal Surveillance Testing today: BPP US 29+1 wks,cephalic,BPP 8/8,CX 5 cm,anterior pl gr 0,afi 29 cm,polyhydramnios,fhr 159 bpm,LVEICF   Results for orders placed or performed in visit on 02/05/18 (from the past 24 hour(s))  POCT Urinalysis Dipstick   Collection Time: 02/05/18  4:19 PM  Result Value Ref Range   Color, UA     Clarity, UA     Glucose, UA Negative Negative   Bilirubin, UA     Ketones, UA neg    Spec Grav, UA  1.010 - 1.025   Blood, UA neg    pH, UA  5.0 - 8.0   Protein, UA Negative Negative   Urobilinogen, UA  0.2 or 1.0 E.U./dL   Nitrite, UA neg    Leukocytes, UA Negative Negative   Appearance     Odor      Assessment & Plan:  1) High-risk pregnancy [id number] at 5731w1d with an Estimated Date of Delivery: 04/22/18   2)  Polyhydramnios, stable  3) Anemia; continue iron  Labs/procedures today:   Medications: FeSO4 BID  Treatment Plan:  Weekly BPPs, CS 39 weeks  Follow-up: Return for BPP w/MFM in one week (doesn't need visit here)  set up weekly BPPs here on Wednesdays.  Orders Placed This Encounter  Procedures  . [place] MFM FETAL BPP WO NON STRESS  . US FETAL BPP WO NON STRESS  . POCT Urinalysis Dipstick   [name] CNM 02/05/2018 4:44 PM

## 2018-02-05 NOTE — Progress Notes (Addendum)
US 29+1 wks,cephalic,BPP 8/8,CX 5 cm,anterior pl gr 0,afi 29 cm,polyhydramnios,fhr 159 bpm,LVEICF

## 2018-02-07 ENCOUNTER — Other Ambulatory Visit: Payer: Self-pay | Admitting: *Deleted

## 2018-02-07 ENCOUNTER — Telehealth: Payer: Self-pay | Admitting: *Deleted

## 2018-02-07 DIAGNOSIS — O403XX0 Polyhydramnios, third trimester, not applicable or unspecified: Secondary | ICD-10-CM

## 2018-02-07 DIAGNOSIS — O403XX1 Polyhydramnios, third trimester, fetus 1: Secondary | ICD-10-CM

## 2018-02-07 DIAGNOSIS — O0990 Supervision of high risk pregnancy, unspecified, unspecified trimester: Secondary | ICD-10-CM

## 2018-02-07 NOTE — Telephone Encounter (Signed)
[facility]

## 2018-02-12 ENCOUNTER — Other Ambulatory Visit: Payer: Self-pay | Admitting: Advanced Practice Midwife

## 2018-02-12 ENCOUNTER — Ambulatory Visit (HOSPITAL_COMMUNITY)
Admission: RE | Admit: 2018-02-12 | Discharge: 2018-02-12 | Disposition: A | Discharge status: Home / Self Care | Payer: Medicaid Other | Source: Ambulatory Visit | Attending: Advanced Practice Midwife | Admitting: Advanced Practice Midwife

## 2018-02-12 DIAGNOSIS — Z3A30 30 weeks gestation of pregnancy: Secondary | ICD-10-CM

## 2018-02-12 DIAGNOSIS — O0990 Supervision of high risk pregnancy, unspecified, unspecified trimester: Secondary | ICD-10-CM

## 2018-02-12 DIAGNOSIS — O403XX0 Polyhydramnios, third trimester, not applicable or unspecified: Secondary | ICD-10-CM | POA: Insufficient documentation

## 2018-02-12 DIAGNOSIS — Z98891 History of uterine scar from previous surgery: Secondary | ICD-10-CM

## 2018-02-12 DIAGNOSIS — O99212 Obesity complicating pregnancy, second trimester: Secondary | ICD-10-CM | POA: Diagnosis not present

## 2018-02-12 DIAGNOSIS — O34219 Maternal care for unspecified type scar from previous cesarean delivery: Secondary | ICD-10-CM | POA: Insufficient documentation

## 2018-02-12 DIAGNOSIS — O99213 Obesity complicating pregnancy, third trimester: Secondary | ICD-10-CM

## 2018-02-14 ENCOUNTER — Other Ambulatory Visit: Payer: Self-pay | Admitting: Advanced Practice Midwife

## 2018-02-14 ENCOUNTER — Encounter (INDEPENDENT_AMBULATORY_CARE_PROVIDER_SITE_OTHER): Payer: Self-pay

## 2018-02-14 DIAGNOSIS — I87009 Postthrombotic syndrome without complications of unspecified extremity: Secondary | ICD-10-CM

## 2018-02-14 DIAGNOSIS — O403XX0 Polyhydramnios, third trimester, not applicable or unspecified: Secondary | ICD-10-CM

## 2018-02-14 NOTE — Progress Notes (Signed)
Per MFM, since poly is mild (<30) don't need testing. Recommended growth US and AFI q 4 weeks.  Pt sent message on mychart

## 2018-02-20 ENCOUNTER — Encounter: Payer: Self-pay | Admitting: Obstetrics and Gynecology

## 2018-02-20 ENCOUNTER — Ambulatory Visit: Payer: Medicaid Other

## 2018-02-20 ENCOUNTER — Ambulatory Visit (INDEPENDENT_AMBULATORY_CARE_PROVIDER_SITE_OTHER): Payer: Medicaid Other | Admitting: Obstetrics and Gynecology

## 2018-02-20 VITALS — BP 115/76 | HR 89 | Wt 209.8 lb

## 2018-02-20 DIAGNOSIS — O1203 Gestational edema, third trimester: Secondary | ICD-10-CM

## 2018-02-20 DIAGNOSIS — Z98891 History of uterine scar from previous surgery: Secondary | ICD-10-CM

## 2018-02-20 DIAGNOSIS — Z331 Pregnant state, incidental: Secondary | ICD-10-CM

## 2018-02-20 DIAGNOSIS — O403XX0 Polyhydramnios, third trimester, not applicable or unspecified: Secondary | ICD-10-CM

## 2018-02-20 DIAGNOSIS — O0990 Supervision of high risk pregnancy, unspecified, unspecified trimester: Secondary | ICD-10-CM

## 2018-02-20 DIAGNOSIS — Z86718 Personal history of other venous thrombosis and embolism: Secondary | ICD-10-CM

## 2018-02-20 DIAGNOSIS — Z3A31 31 weeks gestation of pregnancy: Secondary | ICD-10-CM

## 2018-02-20 DIAGNOSIS — Z1389 Encounter for screening for other disorder: Secondary | ICD-10-CM

## 2018-02-20 LAB — POCT URINALYSIS DIPSTICK
Blood, UA: NEGATIVE
Glucose, UA: NEGATIVE
KETONES UA: NEGATIVE
Leukocytes, UA: NEGATIVE
NITRITE UA: NEGATIVE
PROTEIN UA: NEGATIVE

## 2018-02-20 NOTE — Progress Notes (Addendum)
Patient ID: Baileigh Modisette, female   DOB: Oct 29, 1990, 29 y.o.   MRN: [id number]   [facility] PREGNANCY VISIT Patient name: Shaneen Reeser MRN [id number]  Date of birth: Jan 06, 1991 Chief Complaint:   High Risk Gestation  History of Present Illness:   Camyra Vaeth is a 27 y.o. [id number] female at [redacted]w[redacted]d with an Estimated Date of Delivery: 04/22/18 being seen today for ongoing management of a high-risk pregnancy complicated by mild polyhydramnios.  Which there is no requirement for NSTs but simply for a every 4 week growth ultrasound with AFI Today she reports no complaints. She has mild edema in right ankle and wears a compression sock for relief. She is thinking this will be her last child. She is considering a tubal ligation and is going to continue researching her options. She will make a decision at her next visit.    Contractions: Not present. Vag. Bleeding: None.  Movement: Present. denies leaking of fluid.  Review of Systems:   Pertinent items are noted in HPI Denies abnormal vaginal discharge w/ itching/odor/irritation, headaches, visual changes, shortness of breath, chest pain, abdominal pain, severe nausea/vomiting, or problems with urination or bowel movements unless otherwise stated above. Pertinent History Reviewed:  Reviewed past medical,surgical, social, obstetrical and family history.  Reviewed problem list, medications and allergies. Physical Assessment:   Vitals:   02/20/18 0924  BP: 115/76  Pulse: 89  Weight: 209 lb 12.8 oz (95.2 kg)  Body mass index is 37.16 kg/m.           Physical Examination:   General appearance: alert, well appearing, and in no distress and oriented to person, place, and time  Mental status: alert, oriented to person, place, and time, normal mood, behavior, speech, dress, motor activity, and thought processes, affect appropriate to mood  Skin: warm & dry   Extremities: Edema: Trace    Cardiovascular: normal heart rate noted  Respiratory: normal respiratory  effort, no distress  Abdomen: gravid, soft, non-tender  Pelvic: Cervical exam deferred         Fetal Status: Fetal Heart Rate (bpm): 156 Fundal Height: 34 cm Movement: Present    Fetal Surveillance Testing today: Doppler   Results for orders placed or performed in visit on 02/20/18 (from the past 24 hour(s))  POCT urinalysis dipstick   Collection Time: 02/20/18  9:25 AM  Result Value Ref Range   Color, UA     Clarity, UA     Glucose, UA Negative Negative   Bilirubin, UA     Ketones, UA neg    Spec Grav, UA  1.010 - 1.025   Blood, UA neg    pH, UA  5.0 - 8.0   Protein, UA Negative Negative   Urobilinogen, UA  0.2 or 1.0 E.U./dL   Nitrite, UA neg    Leukocytes, UA Negative Negative   Appearance     Odor      Assessment & Plan:  1) High-risk pregnancy [id number] at [redacted]w[redacted]d with an Estimated Date of Delivery: 04/22/18   2) Polyhydramnios, stable follow with every 4 week growth ultrasound and every 4 weeks AFI  3) Discuss Tubal at next visit __________  Meds: No orders of the defined types were placed in this encounter.   Labs/procedures today: Doppler  Treatment Plan:  Repeat CS @ 39 weeks drilled for 7:30 AM on 15 April 2018 tubal ligation added to scheduling, will be removed if patient changes her mind regarding tubal  Follow-up: Return in about 2 weeks (around 03/06/2018) for  HROB.  Orders Placed This Encounter  Procedures  . POCT urinalysis dipstick   By signing my name below, I, Diona BrownerJennifer Gorman, attest that this documentation has been prepared under the direction and in the presence of Tilda BurrowFerguson, Taeya Theall V, MD. Electronically Signed: Diona BrownerJennifer Gorman, Medical Scribe. 02/20/18. 9:53 AM.  I personally performed the services described in this documentation, which was SCRIBED in my presence. The recorded information has been reviewed and considered accurate. It has been edited as necessary during review. Tilda BurrowJohn V Legend Tumminello, MD

## 2018-02-26 ENCOUNTER — Other Ambulatory Visit: Payer: Medicaid Other

## 2018-02-26 ENCOUNTER — Encounter: Payer: Medicaid Other | Admitting: Women's Health

## 2018-03-04 ENCOUNTER — Telehealth: Payer: Self-pay | Admitting: *Deleted

## 2018-03-04 NOTE — Telephone Encounter (Signed)
Patient states she started having contractions last night about 2 minutes apart for about an hour then she fell asleep. Most of the tightness was noted in her upper abdomen and towards the front. No bleeding or leaking. States she has not been drinking as much water as she normally does.  Informed patient that dehydration can cause contractions and it has been very hot so encouraged patient to push lots of fluids today and rest.  Advised if contractions continued, became stronger, more frequent or had any spotting, leaking or change in fetal movement, to call us or [facility]  Patient verbalized understanding.

## 2018-03-05 ENCOUNTER — Other Ambulatory Visit: Payer: Medicaid Other

## 2018-03-05 ENCOUNTER — Encounter: Payer: Medicaid Other | Admitting: Advanced Practice Midwife

## 2018-03-13 ENCOUNTER — Encounter: Payer: Self-pay | Admitting: Advanced Practice Midwife

## 2018-03-13 ENCOUNTER — Ambulatory Visit (INDEPENDENT_AMBULATORY_CARE_PROVIDER_SITE_OTHER): Payer: Medicaid Other | Admitting: Advanced Practice Midwife

## 2018-03-13 ENCOUNTER — Other Ambulatory Visit: Payer: Self-pay

## 2018-03-13 ENCOUNTER — Ambulatory Visit (INDEPENDENT_AMBULATORY_CARE_PROVIDER_SITE_OTHER): Payer: Medicaid Other

## 2018-03-13 VITALS — BP 121/74 | HR 87 | Wt 212.0 lb

## 2018-03-13 DIAGNOSIS — Z1389 Encounter for screening for other disorder: Secondary | ICD-10-CM

## 2018-03-13 DIAGNOSIS — Z3A34 34 weeks gestation of pregnancy: Secondary | ICD-10-CM

## 2018-03-13 DIAGNOSIS — Z86718 Personal history of other venous thrombosis and embolism: Secondary | ICD-10-CM

## 2018-03-13 DIAGNOSIS — O0993 Supervision of high risk pregnancy, unspecified, third trimester: Secondary | ICD-10-CM

## 2018-03-13 DIAGNOSIS — O403XX0 Polyhydramnios, third trimester, not applicable or unspecified: Secondary | ICD-10-CM | POA: Diagnosis not present

## 2018-03-13 DIAGNOSIS — Z331 Pregnant state, incidental: Secondary | ICD-10-CM

## 2018-03-13 LAB — POCT URINALYSIS DIPSTICK
Blood, UA: NEGATIVE
Glucose, UA: NEGATIVE
KETONES UA: NEGATIVE
LEUKOCYTES UA: NEGATIVE
NITRITE UA: NEGATIVE
PROTEIN UA: NEGATIVE

## 2018-03-13 NOTE — Progress Notes (Signed)
US 34+2 wks,cephalic,anterior pl gr 2,afi 29 cm,fhr 159 bpm,bilat adnexa's wnl,EFW 2854 g 90%,AC 97%

## 2018-03-13 NOTE — Progress Notes (Signed)
  [id number] 759w2d Estimated Date of Delivery: 04/22/18  Blood pressure 121/74, pulse 87, weight 212 lb (96.2 kg), last menstrual period 07/16/2017.   BP weight and urine results all reviewed and noted.  Please refer to the obstetrical flow sheet for the fundal height and fetal heart rate documentation:  Patient reports good fetal movement, denies any bleeding and no rupture of membranes symptoms or regular contractions. Patient does have a lot of BH ctx, to be expected w/poly/4th baby All questions were answered.   Physical Assessment:   Vitals:   03/13/18 1552  BP: 121/74  Pulse: 87  Weight: 212 lb (96.2 kg)  Body mass index is 37.55 kg/m.        Physical Examination:   General appearance: Well appearing, and in no distress  Mental status: Alert, oriented to person, place, and time  Skin: Warm & dry  Cardiovascular: Normal heart rate noted  Respiratory: Normal respiratory effort, no distress  Abdomen: Soft, gravid, nontender  Pelvic: Cervical exam deferred         Extremities: Edema: None  Fetal Status: Fetal Heart Rate (bpm): 158   Movement: Present   US 34+2 wks,cephalic,anterior pl gr 2,afi 29 cm,fhr 159 bpm,bilat adnexa's wnl,EFW 2854 g 90%,AC 97%    Results for orders placed or performed in visit on 03/13/18 (from the past 24 hour(s))  POCT urinalysis dipstick   Collection Time: 03/13/18  3:53 PM  Result Value Ref Range   Color, UA     Clarity, UA     Glucose, UA Negative Negative   Bilirubin, UA     Ketones, UA neg    Spec Grav, UA  1.010 - 1.025   Blood, UA neg    pH, UA  5.0 - 8.0   Protein, UA Negative Negative   Urobilinogen, UA  0.2 or 1.0 E.U./dL   Nitrite, UA neg    Leukocytes, UA Negative Negative   Appearance     Odor       Orders Placed This Encounter  Procedures  . POCT urinalysis dipstick    Plan:  Continued routine obstetrical care, EFW/AFI in 4 weeks  Return in about 2 weeks (around 03/27/2018) for HROB.

## 2018-03-27 ENCOUNTER — Encounter: Payer: Self-pay | Admitting: Women's Health

## 2018-03-27 ENCOUNTER — Ambulatory Visit (INDEPENDENT_AMBULATORY_CARE_PROVIDER_SITE_OTHER): Payer: Medicaid Other | Admitting: Women's Health

## 2018-03-27 VITALS — BP 112/67 | HR 79 | Wt 214.0 lb

## 2018-03-27 DIAGNOSIS — O0993 Supervision of high risk pregnancy, unspecified, third trimester: Secondary | ICD-10-CM

## 2018-03-27 DIAGNOSIS — Z1389 Encounter for screening for other disorder: Secondary | ICD-10-CM

## 2018-03-27 DIAGNOSIS — Z331 Pregnant state, incidental: Secondary | ICD-10-CM

## 2018-03-27 DIAGNOSIS — O403XX0 Polyhydramnios, third trimester, not applicable or unspecified: Secondary | ICD-10-CM

## 2018-03-27 DIAGNOSIS — Z3A36 36 weeks gestation of pregnancy: Secondary | ICD-10-CM

## 2018-03-27 DIAGNOSIS — O34219 Maternal care for unspecified type scar from previous cesarean delivery: Secondary | ICD-10-CM

## 2018-03-27 LAB — POCT URINALYSIS DIPSTICK OB
Blood, UA: NEGATIVE
Glucose, UA: NEGATIVE — AB
Ketones, UA: NEGATIVE
Leukocytes, UA: NEGATIVE
NITRITE UA: NEGATIVE
PROTEIN: NEGATIVE

## 2018-03-27 NOTE — Patient Instructions (Signed)
Janeann ForehandAriel Grondin, I greatly value your feedback.  If you receive a survey following your visit with us today, we appreciate you taking the time to fill it out.  Thanks, Joellyn HaffKim Braedyn Riggle, CNM, WHNP-BC   Call the office [phone]) or go to [facility] if:  You begin to have strong, frequent contractions  Your water breaks.  Sometimes it is a big gush of fluid, sometimes it is just a trickle that keeps getting your panties wet or running down your legs  You have vaginal bleeding.  It is normal to have a small amount of spotting if your cervix was checked.   You don't feel your baby moving like normal.  If you don't, get you something to eat and drink and lay down and focus on feeling your baby move.  You should feel at least 10 movements in 2 hours.  If you don't, you should call the office or go to [facility].     Preterm Labor and Birth Information The normal length of a pregnancy is 39-41 weeks. Preterm labor is when labor starts before 37 completed weeks of pregnancy. What are the risk factors for preterm labor? Preterm labor is more likely to occur in women who:  Have certain infections during pregnancy such as a bladder infection, sexually transmitted infection, or infection inside the uterus (chorioamnionitis).  Have a shorter-than-normal cervix.  Have gone into preterm labor before.  Have had surgery on their cervix.  Are younger than age 29 or older than age 29.  Are African American.  Are pregnant with twins or multiple babies (multiple gestation).  Take street drugs or smoke while pregnant.  Do not gain enough weight while pregnant.  Became pregnant shortly after having been pregnant.  What are the symptoms of preterm labor? Symptoms of preterm labor include:  Cramps similar to those that can happen during a menstrual period. The cramps may happen with diarrhea.  Pain in the abdomen or lower back.  Regular uterine contractions that may feel like tightening of  the abdomen.  A feeling of increased pressure in the pelvis.  Increased watery or bloody mucus discharge from the vagina.  Water breaking (ruptured amniotic sac).  Why is it important to recognize signs of preterm labor? It is important to recognize signs of preterm labor because babies who are born prematurely may not be fully developed. This can put them at an increased risk for:  Long-term (chronic) heart and lung problems.  Difficulty immediately after birth with regulating body systems, including blood sugar, body temperature, heart rate, and breathing rate.  Bleeding in the brain.  Cerebral palsy.  Learning difficulties.  Death.  These risks are highest for babies who are born before 34 weeks of pregnancy. How is preterm labor treated? Treatment depends on the length of your pregnancy, your condition, and the health of your baby. It may involve:  Having a stitch (suture) placed in your cervix to prevent your cervix from opening too early (cerclage).  Taking or being given medicines, such as: ? Hormone medicines. These may be given early in pregnancy to help support the pregnancy. ? Medicine to stop contractions. ? Medicines to help mature the baby's lungs. These may be prescribed if the risk of delivery is high. ? Medicines to prevent your baby from developing cerebral palsy.  If the labor happens before 34 weeks of pregnancy, you may need to stay in the hospital. What should I do if I think I am in preterm labor? If you  think that you are going into preterm labor, call your health care provider right away. How can I prevent preterm labor in future pregnancies? To increase your chance of having a full-term pregnancy:  Do not use any tobacco products, such as cigarettes, chewing tobacco, and e-cigarettes. If you need help quitting, ask your health care provider.  Do not use street drugs or medicines that have not been prescribed to you during your pregnancy.  Talk  with your health care provider before taking any herbal supplements, even if you have been taking them regularly.  Make sure you gain a healthy amount of weight during your pregnancy.  Watch for infection. If you think that you might have an infection, get it checked right away.  Make sure to tell your health care provider if you have gone into preterm labor before.  This information is not intended to replace advice given to you by your health care provider. Make sure you discuss any questions you have with your health care provider. Document Released: 11/10/2003 Document Revised: 01/31/2016 Document Reviewed: 01/11/2016 Elsevier Interactive Patient Education  2018 [employer].

## 2018-03-27 NOTE — Progress Notes (Signed)
   HIGH-RISK PREGNANCY VISIT Patient name: Brandy Barker MRN [id number]  Date of birth: 04/17/1991 Chief Complaint:   High Risk Gestation  History of Present Illness:   Brandy Forehandriel Nehring is a [age] y.o. [id number] female at 7375w2d with an Estimated Date of Delivery: 04/22/18 being seen today for ongoing management of a high-risk pregnancy complicated by polyhydramnios, AFI 29cm.  Today she reports no complaints. Contractions: Irregular. Vag. Bleeding: None.  Movement: Present. denies leaking of fluid.  Review of Systems:   Pertinent items are noted in HPI Denies abnormal vaginal discharge w/ itching/odor/irritation, headaches, visual changes, shortness of breath, chest pain, abdominal pain, severe nausea/vomiting, or problems with urination or bowel movements unless otherwise stated above. Pertinent History Reviewed:  Reviewed past medical,surgical, social, obstetrical and family history.  Reviewed problem list, medications and allergies. Physical Assessment:   Vitals:   03/27/18 1518  BP: 112/67  Pulse: 79  Weight: 214 lb (97.1 kg)  Body mass index is 37.91 kg/m.           Physical Examination:   General appearance: alert, well appearing, and in no distress  Mental status: alert, oriented to person, place, and time  Skin: warm & dry   Extremities: Edema: None    Cardiovascular: normal heart rate noted  Respiratory: normal respiratory effort, no distress  Abdomen: gravid, soft, non-tender  Pelvic: Cervical exam deferred         Fetal Status: Fetal Heart Rate (bpm): 140 Fundal Height: 40 cm Movement: Present    Fetal Surveillance Testing today: doppler   Results for orders placed or performed in visit on 03/27/18 (from the past 24 hour(s))  POC Urinalysis Dipstick OB   Collection Time: 03/27/18  3:19 PM  Result Value Ref Range   Color, UA     Clarity, UA     Glucose, UA Negative (A) (none)   Bilirubin, UA     Ketones, UA neg    Spec Grav, UA  1.010 - 1.025   Blood, UA neg    pH, UA   5.0 - 8.0   POC Protein UA Negative Negative, Trace   Urobilinogen, UA  0.2 or 1.0 E.U./dL   Nitrite, UA neg    Leukocytes, UA Negative Negative   Appearance     Odor      Assessment & Plan:  1) High-risk pregnancy [id number] at 4675w2d with an Estimated Date of Delivery: 04/22/18   2) Polyhydramnios, AFI 29cm @ 34wks, per new guidelines u/s q 4wks, no testing unless AFI >30cm  3) H/O DVT, on Lovenox 40mg  daily  4) Prev c/s x 3> for RCS  Meds: No orders of the defined types were placed in this encounter.  Labs/procedures today: none  Treatment Plan:  Efw/afi u/s in 2wks, deliver @ 39wks (RCS)  Reviewed: Preterm labor symptoms and general obstetric precautions including but not limited to vaginal bleeding, contractions, leaking of fluid and fetal movement were reviewed in detail with the patient.  All questions were answered.  Follow-up: Return in about 1 week (around 04/03/2018) for LROB w/ gbs, then 2wks from now for efw/afi u/s and hrob w/ JVF.  Orders Placed This Encounter  Procedures  . POC Urinalysis Dipstick OB   Cheral MarkerKimberly R Radley Barto CNM, [facility]NP[facility]BC 03/27/2018 3:37 PM

## 2018-03-31 ENCOUNTER — Telehealth (HOSPITAL_COMMUNITY): Payer: Self-pay | Admitting: *Deleted

## 2018-03-31 NOTE — Telephone Encounter (Signed)
Preadmission screen  

## 2018-04-03 ENCOUNTER — Telehealth (HOSPITAL_COMMUNITY): Payer: Self-pay | Admitting: *Deleted

## 2018-04-03 ENCOUNTER — Encounter: Payer: Self-pay | Admitting: Obstetrics & Gynecology

## 2018-04-03 ENCOUNTER — Ambulatory Visit (INDEPENDENT_AMBULATORY_CARE_PROVIDER_SITE_OTHER): Payer: Medicaid Other | Admitting: Obstetrics & Gynecology

## 2018-04-03 VITALS — BP 123/71 | HR 83 | Wt 214.5 lb

## 2018-04-03 DIAGNOSIS — Z1389 Encounter for screening for other disorder: Secondary | ICD-10-CM

## 2018-04-03 DIAGNOSIS — O0993 Supervision of high risk pregnancy, unspecified, third trimester: Secondary | ICD-10-CM

## 2018-04-03 DIAGNOSIS — Z331 Pregnant state, incidental: Secondary | ICD-10-CM

## 2018-04-03 DIAGNOSIS — Z3A37 37 weeks gestation of pregnancy: Secondary | ICD-10-CM

## 2018-04-03 LAB — POCT URINALYSIS DIPSTICK OB
Blood, UA: NEGATIVE
Glucose, UA: NEGATIVE — AB
KETONES UA: NEGATIVE
Leukocytes, UA: NEGATIVE
NITRITE UA: NEGATIVE
PROTEIN: NEGATIVE

## 2018-04-03 NOTE — Telephone Encounter (Signed)
Preadmission screen  

## 2018-04-03 NOTE — Progress Notes (Signed)
[id number] 3344w2d Estimated Date of Delivery: 04/22/18  Blood pressure 123/71, pulse 83, weight 214 lb 8 oz (97.3 kg), last menstrual period 07/16/2017.   BP weight and urine results all reviewed and noted.  Please refer to the obstetrical flow sheet for the fundal height and fetal heart rate documentation:  Patient reports good fetal movement, denies any bleeding and no rupture of membranes symptoms or regular contractions. Patient is without complaints. All questions were answered.  Orders Placed This Encounter  Procedures  . GC/Chlamydia Probe Amp([facility])  . Culture, beta strep (group b only)  . POC Urinalysis Dipstick OB    Plan:  Continued routine obstetrical care, cx LTC Stop lovenox 24 hours before scheduled C section  Return in about 1 week (around 04/10/2018) for LROB with Dr Emelda FearFerguson: pre op.

## 2018-04-04 ENCOUNTER — Telehealth (HOSPITAL_COMMUNITY): Payer: Self-pay | Admitting: *Deleted

## 2018-04-04 ENCOUNTER — Encounter (HOSPITAL_COMMUNITY): Payer: Self-pay

## 2018-04-04 NOTE — Telephone Encounter (Signed)
Preadmission screen  

## 2018-04-06 LAB — GC/CHLAMYDIA PROBE AMP
Chlamydia trachomatis, NAA: NEGATIVE
NEISSERIA GONORRHOEAE BY PCR: NEGATIVE

## 2018-04-06 LAB — CULTURE, BETA STREP (GROUP B ONLY): STREP GP B CULTURE: POSITIVE — AB

## 2018-04-09 ENCOUNTER — Ambulatory Visit (INDEPENDENT_AMBULATORY_CARE_PROVIDER_SITE_OTHER): Payer: Medicaid Other | Admitting: Obstetrics and Gynecology

## 2018-04-09 ENCOUNTER — Other Ambulatory Visit: Payer: Self-pay | Admitting: Obstetrics and Gynecology

## 2018-04-09 ENCOUNTER — Ambulatory Visit (INDEPENDENT_AMBULATORY_CARE_PROVIDER_SITE_OTHER): Payer: Medicaid Other

## 2018-04-09 VITALS — BP 123/71 | HR 81 | Wt 215.4 lb

## 2018-04-09 DIAGNOSIS — Z3A38 38 weeks gestation of pregnancy: Secondary | ICD-10-CM

## 2018-04-09 DIAGNOSIS — O403XX0 Polyhydramnios, third trimester, not applicable or unspecified: Secondary | ICD-10-CM

## 2018-04-09 DIAGNOSIS — O0993 Supervision of high risk pregnancy, unspecified, third trimester: Secondary | ICD-10-CM

## 2018-04-09 DIAGNOSIS — O0990 Supervision of high risk pregnancy, unspecified, unspecified trimester: Secondary | ICD-10-CM

## 2018-04-09 DIAGNOSIS — Z1389 Encounter for screening for other disorder: Secondary | ICD-10-CM

## 2018-04-09 DIAGNOSIS — Z331 Pregnant state, incidental: Secondary | ICD-10-CM

## 2018-04-09 DIAGNOSIS — Z98891 History of uterine scar from previous surgery: Secondary | ICD-10-CM

## 2018-04-09 LAB — POCT URINALYSIS DIPSTICK OB
Blood, UA: NEGATIVE
Glucose, UA: NEGATIVE — AB
KETONES UA: NEGATIVE
Leukocytes, UA: NEGATIVE
Nitrite, UA: NEGATIVE
POC,PROTEIN,UA: NEGATIVE

## 2018-04-09 NOTE — Progress Notes (Signed)
Pt reports that she has decided not to proceed with Tubal ligation at this time. Will perform repeat cesarean only.

## 2018-04-09 NOTE — Progress Notes (Signed)
US 38+1 wks,cephalic,BPP 8/8,FHR 124 bpm,anterior pl gr 2,AFI 24 cm,EFW 3671 g 83%, AC 93%

## 2018-04-09 NOTE — Progress Notes (Signed)
Patient ID: Brandy Barker, female   DOB: 02/13/1991, 27 y.o.   MRN: [id number]    [facility] Patient name: Brandy Barker MRN [id number]  Date of birth: 08/30/1991 Chief Complaint:   Routine Prenatal Visit (US today)  History of Present Illness:   Brandy Barker is a 27 y.o. [phone] female at 8862w1d with an Estimated Date of Delivery: 04/22/18 being seen today for ongoing management of a high-risk pregnancy complicated by Polyhydramnios with mild AFI. She is a repeat C-Section x 3. She will get Nexplanon before fully committing to tubal Today she reports no complaints. Contractions: Irritability. Vag. Bleeding: None.  Movement: Present. denies leaking of fluid.  Review of Systems:   Pertinent items are noted in HPI Denies abnormal vaginal discharge w/ itching/odor/irritation, headaches, visual changes, shortness of breath, chest pain, abdominal pain, severe nausea/vomiting, or problems with urination or bowel movements unless otherwise stated above. Pertinent History Reviewed:  Reviewed past medical,surgical, social, obstetrical and family history.  Reviewed problem list, medications and allergies. Physical Assessment:   Vitals:   04/09/18 1533  BP: 123/71  Pulse: 81  Weight: 215 lb 6.4 oz (97.7 kg)  Body mass index is 38.16 kg/m.           Physical Examination:   General appearance: alert, well appearing, and in no distress and oriented to person, place, and time  Mental status: alert, oriented to person, place, and time, normal mood, behavior, speech, dress, motor activity, and thought processes, affect appropriate to mood  Skin: warm & dry   Extremities: Edema: None    Cardiovascular: normal heart rate noted  Respiratory: normal respiratory effort, no distress  Abdomen: gravid, soft, non-tender  Pelvic: Cervical exam deferred         Fetal Status:     Movement: Present    Fetal Surveillance Testing today: US   Results for orders placed or performed in visit on  04/09/18 (from the past 24 hour(s))  POC Urinalysis Dipstick OB   Collection Time: 04/09/18  3:35 PM  Result Value Ref Range   Color, UA     Clarity, UA     Glucose, UA Negative (A) (none)   Bilirubin, UA     Ketones, UA neg    Spec Grav, UA  1.010 - 1.025   Blood, UA neg    pH, UA  5.0 - 8.0   POC Protein UA Negative Negative, Trace   Urobilinogen, UA  0.2 or 1.0 E.U./dL   Nitrite, UA neg    Leukocytes, UA Negative Negative   Appearance     Odor      Assessment & Plan:  1) High-risk pregnancy [id number] at 7162w1d with an Estimated Date of Delivery: 04/22/18   2) Polyhydramnios with mild AFI, stable   Meds: No orders of the defined types were placed in this encounter.   Labs/procedures today: US  Treatment Plan:   [facility] 2. F/u 2 weeks for Post-op  Follow-up: No follow-ups on file.  Orders Placed This Encounter  Procedures  . POC Urinalysis Dipstick OB   By signing my name below, I, Arnette NorrisMari Johnson, attest that this documentation has been prepared under the direction and in the presence of Tilda BurrowFerguson, Ary Lavine V, MD Electronically Signed: Arnette NorrisMari Johnson Medical Scribe. 04/09/18. 4:35 PM.  I personally performed the services described in this documentation, which was SCRIBED in my presence. The recorded information has been reviewed and considered accurate. It has been edited as necessary during  review. Jonnie Kind, MD

## 2018-04-14 ENCOUNTER — Encounter (HOSPITAL_COMMUNITY)
Admission: RE | Admit: 2018-04-14 | Discharge: 2018-04-14 | Disposition: A | Discharge status: Home / Self Care | Payer: Medicaid Other | Source: Ambulatory Visit | Attending: Obstetrics and Gynecology | Admitting: Obstetrics and Gynecology

## 2018-04-14 LAB — CBC
HEMATOCRIT: 33.9 % — AB (ref 36.0–46.0)
Hemoglobin: 11.1 g/dL — ABNORMAL LOW (ref 12.0–15.0)
MCH: 25.4 pg — ABNORMAL LOW (ref 26.0–34.0)
MCHC: 32.7 g/dL (ref 30.0–36.0)
MCV: 77.6 fL — ABNORMAL LOW (ref 78.0–100.0)
PLATELETS: 194 10*3/uL (ref 150–400)
RBC: 4.37 MIL/uL (ref 3.87–5.11)
RDW: 17 % — AB (ref 11.5–15.5)
WBC: 6.1 10*3/uL (ref 4.0–10.5)

## 2018-04-14 LAB — ABO/RH: ABO/RH(D): A POS

## 2018-04-14 LAB — TYPE AND SCREEN
ABO/RH(D): A POS
ANTIBODY SCREEN: NEGATIVE

## 2018-04-14 NOTE — Pre-Procedure Instructions (Signed)
UA not done with PAT visit.  Dr Emelda FearFerguson notified and no further orders needed.

## 2018-04-14 NOTE — Anesthesia Preprocedure Evaluation (Addendum)
Anesthesia Evaluation  Patient identified by MRN, date of birth, ID band Patient awake    Reviewed: Allergy & Precautions, NPO status , Patient's Chart, lab work & pertinent test results  Airway Mallampati: II  TM Distance: >3 FB Neck ROM: Full    Dental no notable dental hx. (+) Teeth Intact, Dental Advisory Given   Pulmonary neg pulmonary ROS,    Pulmonary exam normal breath sounds clear to auscultation       Cardiovascular Exercise Tolerance: Good negative cardio ROS Normal cardiovascular exam Rhythm:Regular Rate:Normal     Neuro/Psych negative neurological ROS     GI/Hepatic negative GI ROS, Neg liver ROS,   Endo/Other  Morbid obesity  Renal/GU negative Renal ROS     Musculoskeletal   Abdominal (+) + obese,   Peds  Hematology negative hematology ROS (+) anemia ,   Anesthesia Other Findings   Reproductive/Obstetrics (+) Pregnancy                             Lab Results  Component Value Date   WBC 6.1 04/14/2018   HGB 11.1 (L) 04/14/2018   HCT 33.9 (L) 04/14/2018   MCV 77.6 (L) 04/14/2018   PLT 194 04/14/2018    Anesthesia Physical Anesthesia Plan  ASA: III  Anesthesia Plan: Spinal   Post-op Pain Management:    Induction:   PONV Risk Score and Plan:   Airway Management Planned: Natural Airway and Nasal Cannula  Additional Equipment:   Intra-op Plan:   Post-operative Plan:   Informed Consent: I have reviewed the patients History and Physical, chart, labs and discussed the procedure including the risks, benefits and alternatives for the proposed anesthesia with the patient or authorized representative who has indicated his/her understanding and acceptance.     Plan Discussed with: CRNA  Anesthesia Plan Comments:         Anesthesia Quick Evaluation

## 2018-04-14 NOTE — Patient Instructions (Signed)
Brandy Barker  04/14/2018   Your procedure is scheduled on:  04/15/2018  Enter through the Main Entrance of [facility] at 0530 AM.  Pick up the phone at the desk and dial [phone]  Call this number if you have problems the morning of surgery:[phone]  Remember:   Do not eat food:(After Midnight) Desps de medianoche.  Do not drink clear liquids: (After Midnight) Desps de medianoche.  Take these medicines the morning of surgery with A SIP OF WATER: Do not take lovenox the morning of surgery   Do not wear jewelry, make-up or nail polish.  Do not wear lotions, powders, or perfumes. Do not wear deodorant.  Do not shave 48 hours prior to surgery.  Do not bring valuables to the hospital.  [facility] is not   responsible for any belongings or valuables brought to the hospital.  Contacts, dentures or bridgework may not be worn into surgery.  Leave suitcase in the car. After surgery it may be brought to your room.  For patients admitted to the hospital, checkout time is 11:00 AM the day of              discharge.    N/A   Please read over the following fact sheets that you were given:   Surgical Site Infection Prevention

## 2018-04-15 ENCOUNTER — Encounter (HOSPITAL_COMMUNITY): Payer: Self-pay | Admitting: Certified Registered Nurse Anesthetist

## 2018-04-15 ENCOUNTER — Inpatient Hospital Stay (HOSPITAL_COMMUNITY): Payer: Medicaid Other | Admitting: Anesthesiology

## 2018-04-15 ENCOUNTER — Other Ambulatory Visit: Payer: Self-pay

## 2018-04-15 ENCOUNTER — Encounter (HOSPITAL_COMMUNITY)
Admission: RE | Disposition: A | Discharge status: Home / Self Care | Payer: Self-pay | Source: Home / Self Care | Attending: Family Medicine

## 2018-04-15 ENCOUNTER — Inpatient Hospital Stay (HOSPITAL_COMMUNITY)
Admission: RE | Admit: 2018-04-15 | Discharge: 2018-04-17 | DRG: 787 | Disposition: A | Discharge status: Home / Self Care | Payer: Medicaid Other | Attending: Family Medicine | Admitting: Family Medicine

## 2018-04-15 DIAGNOSIS — Z3A39 39 weeks gestation of pregnancy: Secondary | ICD-10-CM

## 2018-04-15 DIAGNOSIS — O34211 Maternal care for low transverse scar from previous cesarean delivery: Secondary | ICD-10-CM | POA: Diagnosis not present

## 2018-04-15 DIAGNOSIS — Z86718 Personal history of other venous thrombosis and embolism: Secondary | ICD-10-CM

## 2018-04-15 DIAGNOSIS — O403XX0 Polyhydramnios, third trimester, not applicable or unspecified: Secondary | ICD-10-CM | POA: Diagnosis present

## 2018-04-15 DIAGNOSIS — O9902 Anemia complicating childbirth: Secondary | ICD-10-CM | POA: Diagnosis not present

## 2018-04-15 DIAGNOSIS — D6859 Other primary thrombophilia: Secondary | ICD-10-CM | POA: Diagnosis not present

## 2018-04-15 DIAGNOSIS — D649 Anemia, unspecified: Secondary | ICD-10-CM | POA: Diagnosis present

## 2018-04-15 DIAGNOSIS — O9912 Other diseases of the blood and blood-forming organs and certain disorders involving the immune mechanism complicating childbirth: Secondary | ICD-10-CM | POA: Diagnosis present

## 2018-04-15 DIAGNOSIS — Z98891 History of uterine scar from previous surgery: Secondary | ICD-10-CM

## 2018-04-15 DIAGNOSIS — O99214 Obesity complicating childbirth: Secondary | ICD-10-CM | POA: Diagnosis present

## 2018-04-15 HISTORY — DX: History of uterine scar from previous surgery: Z98.891

## 2018-04-15 LAB — RPR: RPR Ser Ql: NONREACTIVE

## 2018-04-15 SURGERY — Surgical Case
Anesthesia: Spinal | Laterality: Bilateral

## 2018-04-15 MED ORDER — SIMETHICONE 80 MG PO CHEW
80.0000 mg | CHEWABLE_TABLET | Freq: Three times a day (TID) | ORAL | Status: DC
  Administered 2018-04-15 – 2018-04-17 (×5): 80 mg via ORAL
  Filled 2018-04-15 (×3): qty 1

## 2018-04-15 MED ORDER — LACTATED RINGERS IV SOLN
INTRAVENOUS | Status: DC | PRN
  Administered 2018-04-15: 1000 mL
  Administered 2018-04-15 (×3): via INTRAVENOUS

## 2018-04-15 MED ORDER — ACETAMINOPHEN 10 MG/ML IV SOLN: 1000.0000 mg | Freq: Once | INTRAVENOUS | Status: DC | PRN

## 2018-04-15 MED ORDER — SIMETHICONE 80 MG PO CHEW
80.0000 mg | CHEWABLE_TABLET | ORAL | Status: DC
  Administered 2018-04-16 (×2): 80 mg via ORAL
  Filled 2018-04-15 (×2): qty 1

## 2018-04-15 MED ORDER — OXYTOCIN 10 UNIT/ML IJ SOLN
INTRAMUSCULAR | Status: AC
  Filled 2018-04-15: qty 1

## 2018-04-15 MED ORDER — OXYCODONE-ACETAMINOPHEN 5-325 MG PO TABS
1.0000 | ORAL_TABLET | ORAL | Status: DC | PRN
  Administered 2018-04-15: 1 via ORAL
  Filled 2018-04-15: qty 1

## 2018-04-15 MED ORDER — SODIUM CHLORIDE 0.9 % IR SOLN
Status: DC | PRN
  Administered 2018-04-15: 1

## 2018-04-15 MED ORDER — SENNOSIDES-DOCUSATE SODIUM 8.6-50 MG PO TABS
2.0000 | ORAL_TABLET | ORAL | Status: DC
  Administered 2018-04-16: 2 via ORAL
  Filled 2018-04-15 (×2): qty 2

## 2018-04-15 MED ORDER — IBUPROFEN 600 MG PO TABS
600.0000 mg | ORAL_TABLET | Freq: Four times a day (QID) | ORAL | Status: DC
  Administered 2018-04-16 – 2018-04-17 (×5): 600 mg via ORAL
  Filled 2018-04-15 (×5): qty 1

## 2018-04-15 MED ORDER — HYDROCODONE-ACETAMINOPHEN 7.5-325 MG PO TABS: 1.0000 | ORAL_TABLET | Freq: Once | ORAL | Status: DC | PRN

## 2018-04-15 MED ORDER — LACTATED RINGERS IV SOLN: INTRAVENOUS | Status: DC

## 2018-04-15 MED ORDER — MENTHOL 3 MG MT LOZG
1.0000 | LOZENGE | OROMUCOSAL | Status: DC | PRN
  Filled 2018-04-15: qty 9

## 2018-04-15 MED ORDER — DIPHENHYDRAMINE HCL 25 MG PO CAPS: 25.0000 mg | ORAL_CAPSULE | Freq: Four times a day (QID) | ORAL | Status: DC | PRN

## 2018-04-15 MED ORDER — LACTATED RINGERS IV SOLN
INTRAVENOUS | Status: DC | PRN
  Administered 2018-04-15: 08:00:00 via INTRAVENOUS

## 2018-04-15 MED ORDER — ONDANSETRON HCL 4 MG/2ML IJ SOLN
INTRAMUSCULAR | Status: DC | PRN
  Administered 2018-04-15: 4 mg via INTRAVENOUS

## 2018-04-15 MED ORDER — PROMETHAZINE HCL 25 MG/ML IJ SOLN: 6.2500 mg | INTRAMUSCULAR | Status: DC | PRN

## 2018-04-15 MED ORDER — ACETAMINOPHEN 325 MG PO TABS
650.0000 mg | ORAL_TABLET | ORAL | Status: DC | PRN
  Administered 2018-04-16: 650 mg via ORAL
  Filled 2018-04-15: qty 2

## 2018-04-15 MED ORDER — PRENATAL MULTIVITAMIN CH
1.0000 | ORAL_TABLET | Freq: Every day | ORAL | Status: DC
  Administered 2018-04-16 – 2018-04-17 (×2): 1 via ORAL
  Filled 2018-04-15 (×2): qty 1

## 2018-04-15 MED ORDER — PHENYLEPHRINE HCL 10 MG/ML IJ SOLN
INTRAMUSCULAR | Status: DC | PRN
  Administered 2018-04-15 (×2): 80 ug via INTRAVENOUS

## 2018-04-15 MED ORDER — PHENYLEPHRINE 40 MCG/ML (10ML) SYRINGE FOR IV PUSH (FOR BLOOD PRESSURE SUPPORT)
PREFILLED_SYRINGE | INTRAVENOUS | Status: AC
  Filled 2018-04-15: qty 10

## 2018-04-15 MED ORDER — CEFAZOLIN SODIUM-DEXTROSE 2-4 GM/100ML-% IV SOLN
2.0000 g | INTRAVENOUS | Status: AC
  Administered 2018-04-15 (×2): 2 g via INTRAVENOUS
  Filled 2018-04-15: qty 100

## 2018-04-15 MED ORDER — PHENYLEPHRINE 8 MG IN D5W 100 ML (0.08MG/ML) PREMIX OPTIME
INJECTION | INTRAVENOUS | Status: DC | PRN
  Administered 2018-04-15: 60 ug/min via INTRAVENOUS

## 2018-04-15 MED ORDER — LACTATED RINGERS IV SOLN
INTRAVENOUS | Status: DC
  Administered 2018-04-15: 15:00:00 via INTRAVENOUS

## 2018-04-15 MED ORDER — WITCH HAZEL-GLYCERIN EX PADS: 1.0000 "application " | MEDICATED_PAD | CUTANEOUS | Status: DC | PRN

## 2018-04-15 MED ORDER — OXYTOCIN 10 UNIT/ML IJ SOLN
INTRAVENOUS | Status: DC | PRN
  Administered 2018-04-15: 40 [IU] via INTRAVENOUS

## 2018-04-15 MED ORDER — SIMETHICONE 80 MG PO CHEW
80.0000 mg | CHEWABLE_TABLET | ORAL | Status: DC | PRN
  Filled 2018-04-15: qty 1

## 2018-04-15 MED ORDER — OXYCODONE-ACETAMINOPHEN 5-325 MG PO TABS: 2.0000 | ORAL_TABLET | ORAL | Status: DC | PRN

## 2018-04-15 MED ORDER — ONDANSETRON HCL 4 MG/2ML IJ SOLN
INTRAMUSCULAR | Status: AC
  Filled 2018-04-15: qty 2

## 2018-04-15 MED ORDER — DEXAMETHASONE SODIUM PHOSPHATE 4 MG/ML IJ SOLN
INTRAMUSCULAR | Status: DC | PRN
  Administered 2018-04-15: 4 mg via INTRAVENOUS

## 2018-04-15 MED ORDER — PHENYLEPHRINE 8 MG IN D5W 100 ML (0.08MG/ML) PREMIX OPTIME
INJECTION | INTRAVENOUS | Status: AC
  Filled 2018-04-15: qty 100

## 2018-04-15 MED ORDER — DIBUCAINE 1 % RE OINT
1.0000 "application " | TOPICAL_OINTMENT | RECTAL | Status: DC | PRN
  Filled 2018-04-15: qty 28

## 2018-04-15 MED ORDER — TETANUS-DIPHTH-ACELL PERTUSSIS 5-2.5-18.5 LF-MCG/0.5 IM SUSP
0.5000 mL | Freq: Once | INTRAMUSCULAR | Status: DC
  Filled 2018-04-15: qty 0.5

## 2018-04-15 MED ORDER — IBUPROFEN 600 MG PO TABS: 600.0000 mg | ORAL_TABLET | Freq: Four times a day (QID) | ORAL | Status: DC

## 2018-04-15 MED ORDER — MEPERIDINE HCL 25 MG/ML IJ SOLN
6.2500 mg | INTRAMUSCULAR | Status: DC | PRN
Start: 2018-04-15 — End: 2018-04-15

## 2018-04-15 MED ORDER — DEXAMETHASONE SODIUM PHOSPHATE 4 MG/ML IJ SOLN
INTRAMUSCULAR | Status: AC
  Filled 2018-04-15: qty 1

## 2018-04-15 MED ORDER — OXYTOCIN 40 UNITS IN LACTATED RINGERS INFUSION - SIMPLE MED: 2.5000 [IU]/h | INTRAVENOUS | Status: AC

## 2018-04-15 MED ORDER — ENOXAPARIN SODIUM 40 MG/0.4ML ~~LOC~~ SOLN
40.0000 mg | SUBCUTANEOUS | Status: DC
  Administered 2018-04-16 (×2): 40 mg via SUBCUTANEOUS
  Filled 2018-04-15 (×2): qty 0.4

## 2018-04-15 MED ORDER — BUPIVACAINE IN DEXTROSE 0.75-8.25 % IT SOLN
INTRATHECAL | Status: DC | PRN
  Administered 2018-04-15: 1.6 mL via INTRATHECAL

## 2018-04-15 MED ORDER — ZOLPIDEM TARTRATE 5 MG PO TABS: 5.0000 mg | ORAL_TABLET | Freq: Every evening | ORAL | Status: DC | PRN

## 2018-04-15 MED ORDER — HYDROMORPHONE HCL 1 MG/ML IJ SOLN
INTRAMUSCULAR | Status: AC
  Administered 2018-04-15: 0.25 mg via INTRAVENOUS
  Filled 2018-04-15: qty 0.5

## 2018-04-15 MED ORDER — KETOROLAC TROMETHAMINE 30 MG/ML IJ SOLN
30.0000 mg | Freq: Four times a day (QID) | INTRAMUSCULAR | Status: AC
  Administered 2018-04-15 – 2018-04-16 (×4): 30 mg via INTRAVENOUS
  Filled 2018-04-15 (×4): qty 1

## 2018-04-15 MED ORDER — COCONUT OIL OIL
1.0000 "application " | TOPICAL_OIL | Status: DC | PRN
  Filled 2018-04-15: qty 120

## 2018-04-15 MED ORDER — HYDROMORPHONE HCL 1 MG/ML IJ SOLN
0.2500 mg | INTRAMUSCULAR | Status: DC | PRN
Start: 2018-04-15 — End: 2018-04-15
  Administered 2018-04-15 (×2): 0.25 mg via INTRAVENOUS

## 2018-04-15 SURGICAL SUPPLY — 40 items
BENZOIN TINCTURE PRP APPL 2/3 (GAUZE/BANDAGES/DRESSINGS) ×3 IMPLANT
CLAMP CORD UMBIL (MISCELLANEOUS) IMPLANT
CLOSURE STERI-STRIP 1/2X4 (GAUZE/BANDAGES/DRESSINGS) ×1
CLOSURE WOUND 1/2 X4 (GAUZE/BANDAGES/DRESSINGS)
CLOTH BEACON ORANGE TIMEOUT ST (SAFETY) ×3 IMPLANT
CLSR STERI-STRIP ANTIMIC 1/2X4 (GAUZE/BANDAGES/DRESSINGS) ×2 IMPLANT
DRSG OPSITE POSTOP 4X10 (GAUZE/BANDAGES/DRESSINGS) ×3 IMPLANT
ELECT REM PT RETURN 9FT ADLT (ELECTROSURGICAL) ×3
ELECTRODE REM PT RTRN 9FT ADLT (ELECTROSURGICAL) ×1 IMPLANT
EXTRACTOR VACUUM KIWI (MISCELLANEOUS) IMPLANT
GLOVE BIOGEL PI IND STRL 7.0 (GLOVE) ×1 IMPLANT
GLOVE BIOGEL PI IND STRL 9 (GLOVE) ×1 IMPLANT
GLOVE BIOGEL PI INDICATOR 7.0 (GLOVE) ×2
GLOVE BIOGEL PI INDICATOR 9 (GLOVE) ×2
GLOVE SS PI 9.0 STRL (GLOVE) ×3 IMPLANT
GOWN STRL REUS W/TWL 2XL LVL3 (GOWN DISPOSABLE) ×3 IMPLANT
GOWN STRL REUS W/TWL LRG LVL3 (GOWN DISPOSABLE) ×6 IMPLANT
NEEDLE HYPO 25X5/8 SAFETYGLIDE (NEEDLE) IMPLANT
NS IRRIG 1000ML POUR BTL (IV SOLUTION) ×3 IMPLANT
PACK C SECTION WH (CUSTOM PROCEDURE TRAY) ×3 IMPLANT
PAD ABD 8X10 STRL (GAUZE/BANDAGES/DRESSINGS) ×6 IMPLANT
PAD OB MATERNITY 4.3X12.25 (PERSONAL CARE ITEMS) ×3 IMPLANT
PENCIL SMOKE EVAC W/HOLSTER (ELECTROSURGICAL) ×3 IMPLANT
RETRACTOR TRAXI PANNICULUS (MISCELLANEOUS) ×1 IMPLANT
RTRCTR C-SECT PINK 25CM LRG (MISCELLANEOUS) IMPLANT
RTRCTR C-SECT PINK 34CM XLRG (MISCELLANEOUS) IMPLANT
STRIP CLOSURE SKIN 1/2X4 (GAUZE/BANDAGES/DRESSINGS) IMPLANT
SUT MNCRL 0 VIOLET CTX 36 (SUTURE) ×4 IMPLANT
SUT MONOCRYL 0 CTX 36 (SUTURE) ×8
SUT PLAIN 2 0 (SUTURE) ×2
SUT PLAIN ABS 2-0 CT1 27XMFL (SUTURE) ×1 IMPLANT
SUT VIC AB 0 CT1 27 (SUTURE) ×2
SUT VIC AB 0 CT1 27XBRD ANBCTR (SUTURE) ×1 IMPLANT
SUT VIC AB 2-0 CT1 27 (SUTURE) ×4
SUT VIC AB 2-0 CT1 TAPERPNT 27 (SUTURE) ×2 IMPLANT
SUT VIC AB 4-0 KS 27 (SUTURE) ×3 IMPLANT
SYR BULB IRRIGATION 50ML (SYRINGE) IMPLANT
TOWEL OR 17X24 6PK STRL BLUE (TOWEL DISPOSABLE) ×3 IMPLANT
TRAXI PANNICULUS RETRACTOR (MISCELLANEOUS) ×2
TRAY FOLEY W/BAG SLVR 14FR LF (SET/KITS/TRAYS/PACK) ×3 IMPLANT

## 2018-04-15 NOTE — Addendum Note (Signed)
Addendum  created 04/15/18 1317 by Franco NonesYates, Cote Mayabb S, CRNA   Sign clinical note

## 2018-04-15 NOTE — Anesthesia Procedure Notes (Signed)
Spinal  Patient location during procedure: OB Start time: 04/15/2018 7:40 AM End time: 04/15/2018 7:47 AM Staffing Anesthesiologist: Trevor IhaHouser, Phoenicia Pirie A, MD Performed: anesthesiologist  Preanesthetic Checklist Completed: patient identified, surgical consent, pre-op evaluation, timeout performed, IV checked, risks and benefits discussed and monitors and equipment checked Spinal Block Patient position: sitting Prep: site prepped and draped and DuraPrep Patient monitoring: heart rate, cardiac monitor, continuous pulse ox and blood pressure Approach: midline Location: L3-4 Injection technique: single-shot Needle Needle type: Pencan  Needle gauge: 24 G Needle length: 10 cm Assessment Sensory level: T4 Additional Notes 2 attempts . Pt tolerated procedure well.

## 2018-04-15 NOTE — Anesthesia Postprocedure Evaluation (Signed)
Anesthesia Post Note  Patient: Event organiserAriel Barker  Procedure(s) Performed: REPEAT CESAREAN SECTION (Bilateral )     Patient location during evaluation: Mother Baby Anesthesia Type: Spinal Level of consciousness: oriented and awake and alert Pain management: pain level controlled Vital Signs Assessment: post-procedure vital signs reviewed and stable Respiratory status: spontaneous breathing and respiratory function stable Cardiovascular status: stable Postop Assessment: no headache, no backache, no apparent nausea or vomiting, spinal receding and patient able to bend at knees Anesthetic complications: no    Last Vitals:  Vitals:   04/15/18 1026 04/15/18 1135  BP: 102/69 (!) 93/58  Pulse: 62 69  Resp: 18 16  Temp: 36.7 C 36.7 C  SpO2: 100% 100%    Last Pain:  Vitals:   04/15/18 1135  TempSrc:   PainSc: 4    Pain Goal: Patients Stated Pain Goal: 3 (04/15/18 1030)               Minerva AreolaYATES,Zacary Bauer

## 2018-04-15 NOTE — Anesthesia Postprocedure Evaluation (Signed)
Anesthesia Post Note  Patient: Event organiserAriel Diefenderfer  Procedure(s) Performed: REPEAT CESAREAN SECTION (Bilateral )     Patient location during evaluation: PACU Anesthesia Type: Spinal Level of consciousness: oriented and awake and alert Pain management: pain level controlled Vital Signs Assessment: post-procedure vital signs reviewed and stable Respiratory status: spontaneous breathing, respiratory function stable and patient connected to nasal cannula oxygen Cardiovascular status: blood pressure returned to baseline and stable Postop Assessment: no headache, no backache and no apparent nausea or vomiting Anesthetic complications: no    Last Vitals:  Vitals:   04/15/18 0918 04/15/18 0930  BP: 93/62 97/71  Pulse: 74 72  Resp: 18 17  Temp:    SpO2: 99% 98%    Last Pain:  Vitals:   04/15/18 0912  TempSrc: Oral  PainSc:    Pain Goal:                 Trevor IhaStephen A Toniyah Dilmore

## 2018-04-15 NOTE — Op Note (Signed)
04/15/2018  9:08 AM  PATIENT:  Brandy Barker  27 y.o. female  PRE-OPERATIVE DIAGNOSIS:  Pregnancy 39 wk , prior cesarean section x 3, history of DVT, history of protein S deficiency  POST-OPERATIVE DIAGNOSIS:  REPEAT CS; low transverse  PROCEDURE:  Procedure(s): REPEAT CESAREAN SECTION (Bilateral)  SURGEON:  Surgeon(s) and Role:    * Tilda BurrowFerguson, Sedonia Kitner V, MD - Primary  PHYSICIAN ASSISTANT:   ASSISTANTS: Shelton, CST  ANESTHESIA:   spinal  EBL:  763 mL   BLOOD ADMINISTERED:none  DRAINS: Urinary Catheter (Foley)   LOCAL MEDICATIONS USED:  MARCAINE     SPECIMEN:  Source of Specimen:  Placenta to labor and delivery  DISPOSITION OF SPECIMEN:  PATHOLOGY  COUNTS:  YES  TOURNIQUET:  * No tourniquets in log *  DICTATION: .Dragon Dictation  PLAN OF CARE: Admit to inpatient   PATIENT DISPOSITION:  PACU - hemodynamically stable.   Delay start of Pharmacological VTE agent (>24hrs) due to surgical blood loss or risk of bleeding: not applicable Details of procedure: Patient was taken the operating room prepped and draped for lower abdominal surgery, timeout conducted Foley catheter placed Ancef administered, and procedure confirmed by operative team. Transverse lower abdominal incision was made 2 cm above the prior incision, with sharp dissection uterine segment was super thin and transverse incision made site of prior incisions, extended laterally and fetal vertex rotated through the incision and delivered without difficulty using fundal pressure.  Placenta delivered spontaneously intact membranes intact.  There were a couple of large varicosities in the lower uterine segment just above the cervix, that required a horizontal mattress suture to get them to stop after 5 minutes or more of pressure on the site.  The cervix was a tiny opening, the internal os was approximately 2 mm allowed Kelly clamp to be placed placed through without difficulty but was rather fibrotic. The horizontal  mattress suture was placed just posterior to the internal loss, being careful to  stay very superficial. The uterus was closed in a single single layer running locking first layer using 0 Monocryl beginning at each corner and swelling to the middle, then second layer of continuous running 0 Monocryl oversewing was performed.  Hemostasis was satisfactory.  Abdomen was cleansed out, the anterior peritoneum closed with running 2-0 Vicryl the fascia closed with running 0 Vicryl the subcutaneous tissues mobilized inferiorly slightly and then 3 horizontal mattress sutures of 2 oh plain placed and then subcuticular 4-0 Vicryl closed the skin incision.  Benzoin Steri-Strips and honeycomb dressing were applied Sponge and needle counts were correct patient to recovery room in good condition.

## 2018-04-15 NOTE — Anesthesia Procedure Notes (Deleted)
Anesthesia Regional Block: Narrative:       

## 2018-04-15 NOTE — Transfer of Care (Signed)
Immediate Anesthesia Transfer of Care Note  Patient: Brandy Barker  Procedure(s) Performed: REPEAT CESAREAN SECTION (Bilateral )  Patient Location: PACU  Anesthesia Type:Spinal  Level of Consciousness: awake and alert   Airway & Oxygen Therapy: Patient Spontanous Breathing  Post-op Assessment: Report given to RN  Post vital signs: Reviewed and stable  Last Vitals:  Vitals Value Taken Time  BP 93/62 04/15/2018  9:07 AM  Temp    Pulse 78 04/15/2018  9:08 AM  Resp 14 04/15/2018  9:08 AM  SpO2 99 % 04/15/2018  9:08 AM  Vitals shown include unvalidated device data.  Last Pain:  Vitals:   04/15/18 0559  TempSrc: Oral         Complications: No apparent anesthesia complications

## 2018-04-15 NOTE — Op Note (Signed)
See the brief operative note for surgical details 

## 2018-04-15 NOTE — H&P (Signed)
Brandy Barker is a 27 y.o. [id number] at 10768w0d  presenting for Repeat cesarean section. She has been on Lovenox for Hx of DVT, last dose yesterday morning, none since. She has mild polyhydramnios, with normal fetal growth at 94 %ile. GTT normal..71/141/106. She has Protein S deficiency.  OB History    Gravida  6   Para  3   Term  3   Preterm      AB  2   Living  3     SAB  2   TAB      Ectopic      Multiple      Live Births  3          Past Medical History:  Diagnosis Date  . DVT (deep venous thrombosis) (HCC)   . Protein S deficiency (HCC) 2016   Past Surgical History:  Procedure Laterality Date  . CESAREAN SECTION    . cesearan     Family History: family history includes Cancer in her paternal grandfather and paternal grandmother; Diabetes in her paternal grandmother; Hypertension in her father; Other in her sister. Social History:  reports that she has never smoked. She has never used smokeless tobacco. She reports that she does not drink alcohol or use drugs.     Maternal Diabetes: No Genetic Screening: Normal Maternal Ultrasounds/Referrals: Abnormal:  Findings:   Other: mild polyhydramnios with normal anatomic scan Fetal Ultrasounds or other Referrals:  None Maternal Substance Abuse:  No Significant Maternal Medications:  None Significant Maternal Lab Results:  None Other Comments:  None  ROS History   Blood pressure 115/78, pulse 74, temperature 98.3 F (36.8 C), temperature source Oral, resp. rate 16, height 5\' 3"  (1.6 m), weight 94.8 kg, last menstrual period 07/16/2017. Exam Physical Exam  Constitutional: She is oriented to person, place, and time. She appears well-developed and well-nourished.  HENT:  Head: Normocephalic and atraumatic.  Eyes: Pupils are equal, round, and reactive to light.  Neck: Normal range of motion.  Cardiovascular: Normal rate.  Respiratory: Effort normal.  GI: Soft.  Gravid uterus, with EFW 8 lb. FH 42   Musculoskeletal: Normal range of motion.  Neurological: She is alert and oriented to person, place, and time. She has normal reflexes.  Skin: Skin is warm and dry.  Psychiatric: She has a normal mood and affect. Her behavior is normal. Judgment and thought content normal.    Prenatal labs: ABO, Rh: --/--/A POS, A POS Performed at [facility], [address] [place], [place] [address]  [phone](08/12 1120) Antibody: NEG (08/12 1120) Rubella: 2.57 (03/25 0932) RPR: Non Reactive (08/12 1053)  HBsAg: Negative (03/25 0932)  HIV: Non Reactive (05/20 0903)  GBS:     Assessment/Plan: Pregnancy 39wk  Hx DVT associated with protein S Deficiency     Brandy BurrowJohn V Floraine Buechler 04/15/2018, 7:19 AM

## 2018-04-16 DIAGNOSIS — O34211 Maternal care for low transverse scar from previous cesarean delivery: Secondary | ICD-10-CM

## 2018-04-16 DIAGNOSIS — Z3A39 39 weeks gestation of pregnancy: Secondary | ICD-10-CM

## 2018-04-16 LAB — CBC
HEMATOCRIT: 31.3 % — AB (ref 36.0–46.0)
HEMOGLOBIN: 10.2 g/dL — AB (ref 12.0–15.0)
MCH: 25.4 pg — ABNORMAL LOW (ref 26.0–34.0)
MCHC: 32.6 g/dL (ref 30.0–36.0)
MCV: 78.1 fL (ref 78.0–100.0)
Platelets: 213 10*3/uL (ref 150–400)
RBC: 4.01 MIL/uL (ref 3.87–5.11)
RDW: 17.1 % — ABNORMAL HIGH (ref 11.5–15.5)
WBC: 10.3 10*3/uL (ref 4.0–10.5)

## 2018-04-16 LAB — BIRTH TISSUE RECOVERY COLLECTION (PLACENTA DONATION)

## 2018-04-16 NOTE — Progress Notes (Signed)
POSTPARTUM PROGRESS NOTE  POD #1  Subjective:  Janeann Forehandriel Lynne is a 27 y.o. [id number] s/p rLTCS at 7333w0d.  Pregnancy complicated by mild polyhydramnios. She reports she doing well. No acute events overnight. She denies any problems with ambulating, voiding or po intake. Denies nausea or vomiting. She has  passed flatus. Pain is well controlled.  Lochia is appropriate.  Objective: Blood pressure 103/65, pulse 67, temperature 98.2 F (36.8 C), temperature source Oral, resp. rate 16, height 5\' 3"  (1.6 m), weight 94.8 kg, last menstrual period 07/16/2017, SpO2 100 %, unknown if currently breastfeeding.  Physical Exam:  General: alert, cooperative and no distress Chest: no respiratory distress Heart:regular rate, distal pulses intact Abdomen: soft, nontender,  Uterine Fundus: firm, appropriately tender DVT Evaluation: No calf swelling or tenderness Extremities: No LE edema Skin: warm, dry; incision clean/dry/intact w/ honeycomb dressing in place  Recent Labs    04/14/18 1053 04/16/18 0557  HGB 11.1* 10.2*  HCT 33.9* 31.3*    Assessment/Plan: Janeann Forehandriel Feutz is a 27 y.o. [id number] s/p rLTCS at 5133w0d.  POD#1 - Doing welll; pain well controlled. Repeat H/H appropriate (HgB 11.1>10.2)   Routine postpartum care  OOB, ambulated Hx of DVT: Continue Lovenox  Contraception: Nexplanon Feeding: Breast  Dispo: Plan for discharge tomorrow.   LOS: 1 day   Marcy Sirenatherine Wallace, D.O. OB Fellow  04/16/2018, 6:52 AM

## 2018-04-17 ENCOUNTER — Ambulatory Visit: Payer: Self-pay

## 2018-04-17 MED ORDER — IBUPROFEN 600 MG PO TABS: 600.0000 mg | ORAL_TABLET | Freq: Four times a day (QID) | ORAL | 0 refills | Status: DC | PRN

## 2018-04-17 MED ORDER — OXYCODONE-ACETAMINOPHEN 5-325 MG PO TABS: 1.0000 | ORAL_TABLET | Freq: Four times a day (QID) | ORAL | 0 refills | Status: DC | PRN

## 2018-04-17 NOTE — Discharge Summary (Addendum)
OB Discharge Summary     Patient Name: Brandy Barker DOB: 12/08/1990 MRN: [id number]  Date of admission: 04/15/2018 Delivering MD: Tilda BurrowFERGUSON, JOHN V   Date of discharge: 04/17/2018  Admitting diagnosis: REPEAT CS;DESIRE STERILIZATION Intrauterine pregnancy: 7627w0d     Secondary diagnosis:  Active Problems:   History of DVT (deep vein thrombosis)   Previous cesarean section   Status post repeat low transverse cesarean section  Discharge diagnosis: Term Pregnancy Delivered                                                                                                Post partum procedures:None  Augmentation: None  Complications: None  Hospital course:  Sceduled C/S   27 y.o. yo [id number] at 6127w0d was admitted to the hospital 04/15/2018 for scheduled cesarean section with the following indication:Elective Repeat.  Membrane Rupture Time/Date: 8:13 AM ,04/15/2018   Patient delivered a Viable infant.04/15/2018  Details of operation can be found in separate operative note.  Pateint had an uncomplicated postpartum course.  She is ambulating, tolerating a regular diet, passing flatus, and urinating well. Patient is discharged home in stable condition on  04/17/18.  She was discharged to continue her Lovenox until she is seen by her OB provider         Physical exam  Vitals:   04/16/18 0030 04/16/18 0430 04/16/18 2145 04/17/18 0626  BP: 114/68 103/65 106/81 120/78  Pulse: 72 67 68 70  Resp: 16 16  16   Temp: 98.2 F (36.8 C) 98.2 F (36.8 C) 98 F (36.7 C) 98.2 F (36.8 C)  TempSrc: Oral Oral Oral Oral  SpO2: 100% 100% 100% 100%  Weight:      Height:       General: alert, cooperative and no distress Lochia: appropriate Uterine Fundus: firm Incision: Healing well with no significant drainage, No significant erythema, Dressing is clean, dry, and intact DVT Evaluation: No evidence of DVT seen on physical exam. Labs: Lab Results  Component Value Date   WBC 10.3 04/16/2018   HGB  10.2 (L) 04/16/2018   HCT 31.3 (L) 04/16/2018   MCV 78.1 04/16/2018   PLT 213 04/16/2018   CMP Latest Ref Rng & Units 09/02/2017  Glucose 65 - 99 mg/dL 147(W123(H)  BUN 6 - 20 mg/dL 10  Creatinine 2.950.44 - 6.211.00 mg/dL 3.080.70  Sodium 657135 - 846145 mmol/L 133(L)  Potassium 3.5 - 5.1 mmol/L 3.4(L)  Chloride 101 - 111 mmol/L 103  CO2 22 - 32 mmol/L 22  Calcium 8.9 - 10.3 mg/dL 9.6(E8.4(L)  Total Protein 6.5 - 8.1 g/dL -  Total Bilirubin 0.3 - 1.2 mg/dL -  Alkaline Phos 38 - 952126 U/L -  AST 15 - 41 U/L -  ALT 14 - 54 U/L -    Discharge instruction: per After Visit Summary and "Baby and Me Booklet".  After visit meds:  Allergies as of 04/17/2018   No Known Allergies     Medication List    TAKE these medications   enoxaparin 40 MG/0.4ML injection Commonly known as:  LOVENOX Inject 0.4 mLs (40 mg total) into  the skin daily.   ibuprofen 600 MG tablet Commonly known as:  ADVIL,MOTRIN Take 1 tablet (600 mg total) by mouth every 6 (six) hours as needed for moderate pain.   multivitamin-prenatal 27-0.8 MG Tabs tablet Take 1 tablet by mouth daily.   oxyCODONE-acetaminophen 5-325 MG tablet Commonly known as:  PERCOCET/ROXICET Take 1 tablet by mouth every 6 (six) hours as needed (pain that relieved by ibuprofen or tylenol).       Diet: routine diet  Activity: Advance as tolerated. Pelvic rest for 6 weeks.   Outpatient follow up:2 weeks and 4 weeks for incision check and PP visit, respectively Follow up Appt: Future Appointments  Date Time Provider Department Center  04/23/2018 11:30 AM Tilda BurrowFerguson, John V, MD [facility]   Follow up Visit:No follow-ups on file.  Postpartum contraception: Nexplanon  Newborn Data: Live born female  Birth Weight: 7 lb 14.5 oz (3585 g) APGAR: 10, 10  Newborn Delivery   Birth date/time:  04/15/2018 08:14:00 Delivery type:  C-Section, Low Transverse Trial of labor:  No C-section categorization:  Repeat     Baby Feeding: Breast Disposition:home with  mother   Addendum: I agree with the resident's documentation. Patient was discharged home with instructions to continue Lovenox until follow-up appointment. She will then return to her home Xarelto for DVT prophylaxis in the setting of Protein C-S deficiency.

## 2018-04-17 NOTE — Lactation Note (Signed)
This note was copied from a baby's chart. Lactation Consultation Note  Patient Name: Brandy Barker ZOXWR'UToday's Date: 04/17/2018 Reason for consult: Initial assessment;Term   Initial consult with Exp BF mom of 52 hour old infant. Mom and infant were walking out the door when [facility].   Infant with 7 BF for 10-50 minutes, 4 voids and 5 stools in the last 24 hours. Infant weight 7 pounds 7.9 ounces with 5% weight loss since birth. LATCH scores 8.   Mom reports BF is going well. She reports her milk is in. She has a bottle of about an ounce of colostrum that she is taking home that she pumped earlier today. Mom has manual pump for home use.   Reviewed I/O, signs of dehydration in the infant, signs your baby is getting enough, engorgement prevention/treatment, and breast milk storage and expression.   BF Resources handout and LC Brochure given, mom informed of OP services, BF Support Groups and LC phone #. Mom reports she has no questions/concerns at this time. Mom to call with any questions as needed.      Maternal Data Formula Feeding for Exclusion: No Has patient been taught Hand Expression?: Yes Does the patient have breastfeeding experience prior to this delivery?: Yes  Feeding    LATCH Score                   Interventions    Lactation Tools Discussed/Used WIC Program: Yes Pump Review: Setup, frequency, and cleaning;Milk Storage   Consult Status Consult Status: Complete    Brandy Barker 04/17/2018, 12:23 PM

## 2018-04-23 ENCOUNTER — Encounter: Payer: Self-pay | Admitting: Obstetrics and Gynecology

## 2018-04-23 ENCOUNTER — Ambulatory Visit (INDEPENDENT_AMBULATORY_CARE_PROVIDER_SITE_OTHER): Payer: Medicaid Other | Admitting: Obstetrics and Gynecology

## 2018-04-23 ENCOUNTER — Other Ambulatory Visit: Payer: Self-pay

## 2018-04-23 VITALS — BP 127/78 | HR 79 | Ht 63.0 in | Wt 185.8 lb

## 2018-04-23 DIAGNOSIS — Z09 Encounter for follow-up examination after completed treatment for conditions other than malignant neoplasm: Secondary | ICD-10-CM

## 2018-04-23 DIAGNOSIS — Z9889 Other specified postprocedural states: Secondary | ICD-10-CM

## 2018-04-23 NOTE — Progress Notes (Signed)
Patient ID: Brandy Barker, female   DOB: 11/07/1990, 27 y.o.   MRN: [id number]     Subjective:  Brandy Barker is a 27 y.o. female now 1 weeks status post Repeat C-section.   Review of Systems Negative except   Diet:   normal   Bowel movements : normal.  Pain is controlled with current analgesics. Medications being used: Motrin for pain and discomfort, is not taking percocet.   Objective:  There were no vitals taken for this visit. General:Well developed, well nourished.  No acute distress. Abdomen: Bowel sounds normal, soft, non-tender. Pelvic Exam: NOT DONE Incision(s): Healing well, no drainage, no erythema, no hernia, no swelling, no dehiscence,     Assessment:  Post-Op 1 weeks s/p Repeat C-section   Doing well postoperatively.   Plan:  1.Wound care discussed. Acetone to help with surgical clue 2. . current medications. Taking Lovenox for next 6 weeks 3. Activity restrictions: routine postop 4. return to work: 12 weeks. 5. Follow up in 4 Weeks for post-partum visit.  By signing my name below, I, Arnette NorrisMari Johnson, attest that this documentation has been prepared under the direction and in the presence of Tilda BurrowFerguson, Zaniel Marineau V, MD. Electronically Signed: Arnette NorrisMari Johnson Medical Scribe. 04/23/18. 11:46 AM.  I personally performed the services described in this documentation, which was SCRIBED in my presence. The recorded information has been reviewed and considered accurate. It has been edited as necessary during review. Tilda BurrowJohn V Kazuki Ingle, MD

## 2018-05-06 ENCOUNTER — Telehealth: Payer: Self-pay | Admitting: *Deleted

## 2018-05-06 NOTE — Telephone Encounter (Signed)
Dr Emelda Fear did her surgery, he would probably like to take a look at it tomorrow

## 2018-05-06 NOTE — Telephone Encounter (Signed)
Patient states she has noticed an odor coming from her incision for about a week now.  Slight redness but thinks it is coming from where her clothes are touching it. No fever noted.  Please advise.

## 2018-05-07 ENCOUNTER — Ambulatory Visit (INDEPENDENT_AMBULATORY_CARE_PROVIDER_SITE_OTHER): Payer: Medicaid Other | Admitting: Obstetrics and Gynecology

## 2018-05-07 ENCOUNTER — Encounter: Payer: Self-pay | Admitting: Obstetrics and Gynecology

## 2018-05-07 ENCOUNTER — Other Ambulatory Visit: Payer: Self-pay

## 2018-05-07 VITALS — BP 127/73 | HR 71 | Ht 63.0 in | Wt 183.0 lb

## 2018-05-07 DIAGNOSIS — Z9889 Other specified postprocedural states: Secondary | ICD-10-CM

## 2018-05-07 DIAGNOSIS — L929 Granulomatous disorder of the skin and subcutaneous tissue, unspecified: Secondary | ICD-10-CM

## 2018-05-07 NOTE — Progress Notes (Signed)
Patient ID: Brandy Barker, female   DOB: Oct 31, 1990, 27 y.o.   MRN: [id number]  Subjective:  Brandy Barker is a 27 y.o. female now 3 weeks status post C-Section.  She notices that her incision has an odor. She has been sweating some this morning so there is moisture trapped there  Review of Systems Negative except   Diet:   normal   Bowel movements : normal.  The patient is not having any pain.  Objective:  BP 127/73 (BP Location: Right Arm, Patient Position: Sitting, Cuff Size: Normal)   Pulse 71   Ht 5\' 3"  (1.6 m)   Wt 183 lb (83 kg)   Breastfeeding? Yes   BMI 32.42 kg/m  General:Well developed, well nourished.  No acute distress. Abdomen: Bowel sounds normal, soft, non-tender. Pelvic Exam: NOT DONE  Incision(s): Shows signs of weepy tissue at incision site, weeps serous fluid, no purulence  2 mm strip of granulation tissue along the incision with overlap of upper side of incision.   Assessment:  Post-Op 3 weeks s/p C-Section  Granular tissue along incision,    Plan:  1. Neosporin near the incision 2. F/u in 2 weeks   By signing my name below, I, Brandy Barker, attest that this documentation has been prepared under the direction and in the presence of Tilda Burrow, MD. Electronically Signed: Arnette Barker Medical Scribe. 05/07/18. 11:32 AM.  I personally performed the services described in this documentation, which was SCRIBED in my presence. The recorded information has been reviewed and considered accurate. It has been edited as necessary during review. Tilda Burrow, MD

## 2018-05-21 ENCOUNTER — Ambulatory Visit: Payer: Medicaid Other | Admitting: Advanced Practice Midwife

## 2018-06-04 IMAGING — CT CT ANGIO CHEST
2 of 6 series · 19 of 46 positions shown · IV contrast (Isovue)
Comparison: Chest radiography same day

CLINICAL DATA: Chest pain and shortness of breath over the last
several days. History of deep venous thrombosis 2636.

EXAM:
CT ANGIOGRAPHY CHEST WITH CONTRAST
TECHNIQUE: Multidetector CT imaging of the chest was performed using the
standard protocol during bolus administration of intravenous
contrast. Multiplanar CT image reconstructions and MIPs were
obtained to evaluate the vascular anatomy.
CONTRAST:  100 cc Isovue 370

[Series 7: thins · axial · 0.62mm/px · z∈[+1159,+1416]mm · 16 of 283 slices shown]
[im 13/283  lung]
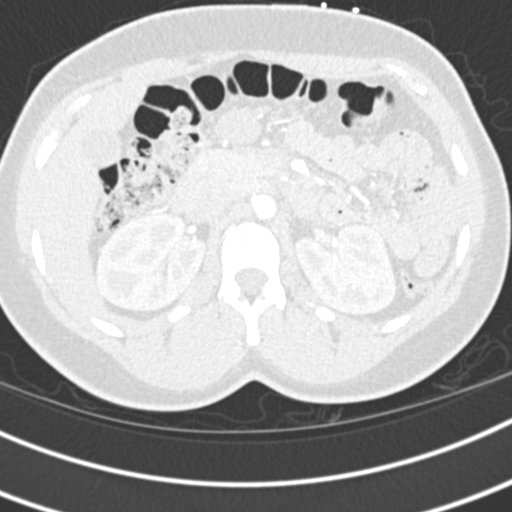
[im 37/283  soft-tissue]
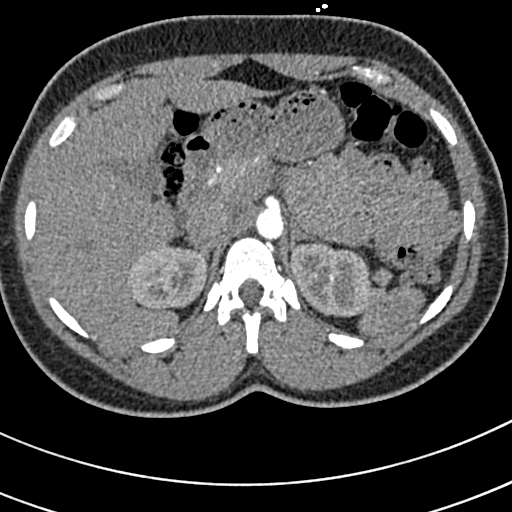
[im 50/283  lung]
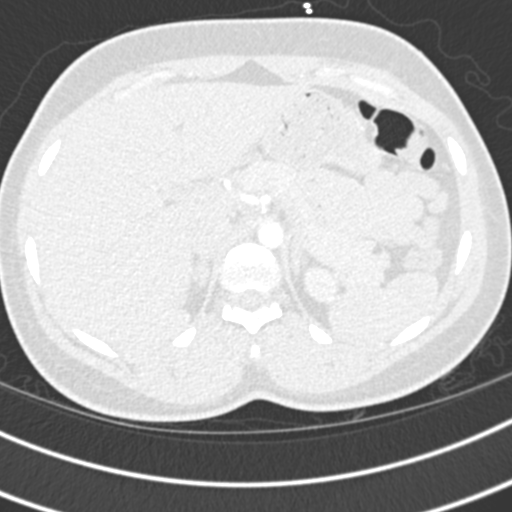
[im 62/283  soft-tissue]
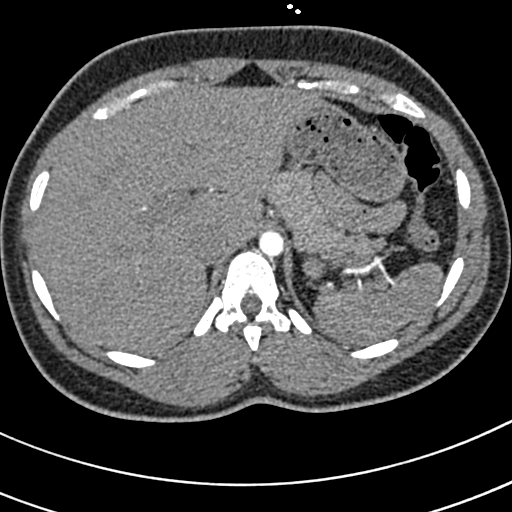
[im 86/283  lung]
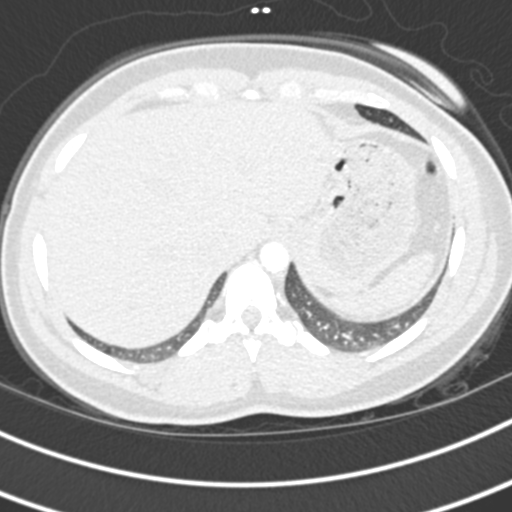
[im 99/283  soft-tissue]
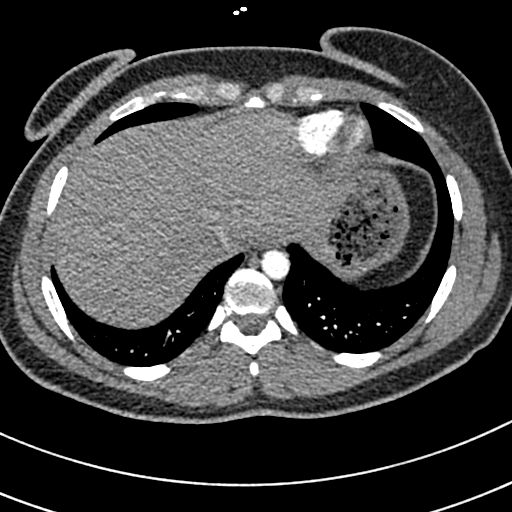
[im 111/283  lung]
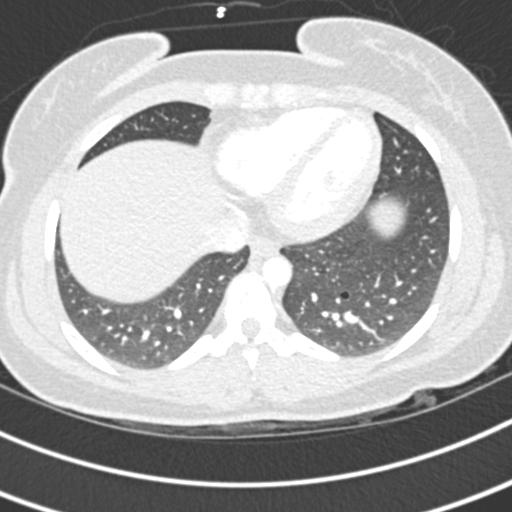
[im 135/283  soft-tissue]
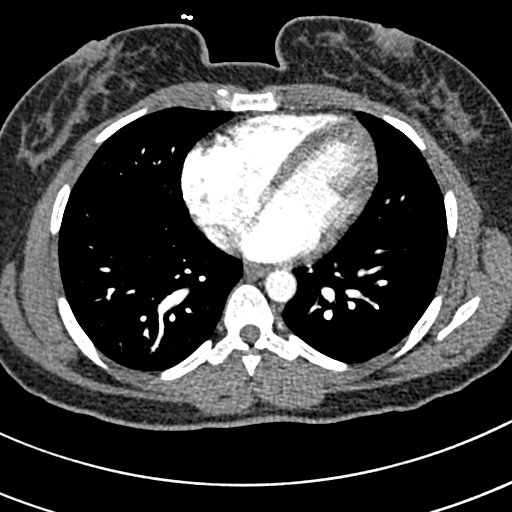
[im 148/283  lung]
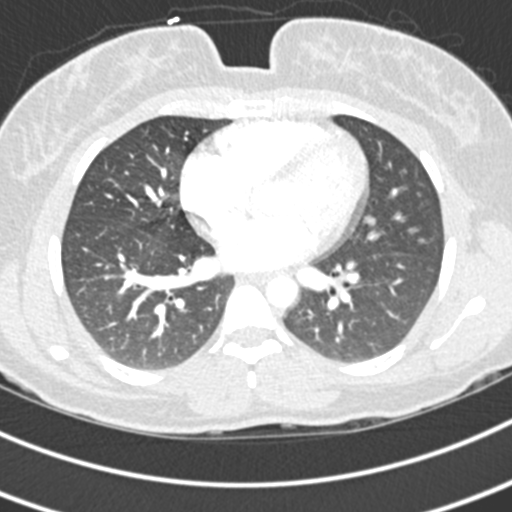
[im 172/283  soft-tissue]
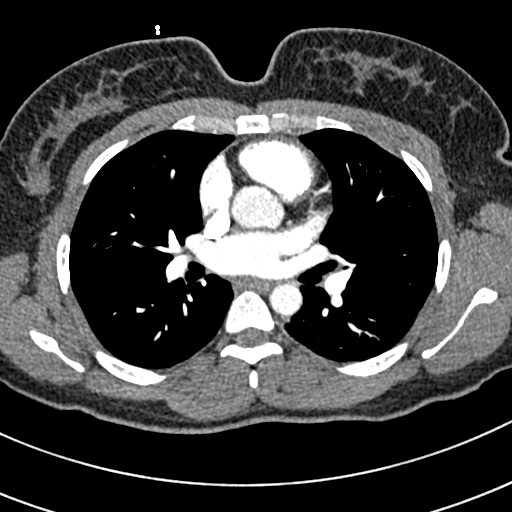
[im 184/283  lung]
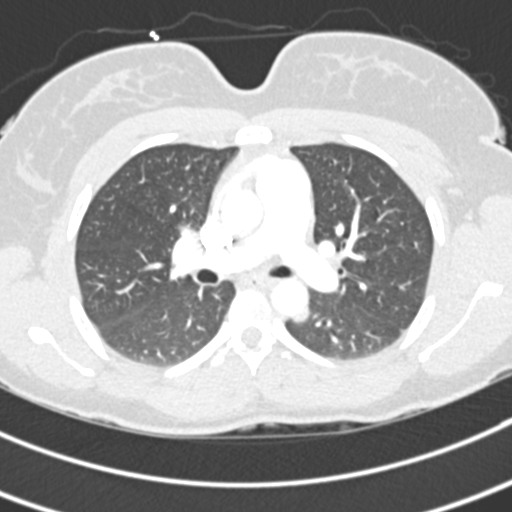
[im 197/283  soft-tissue]
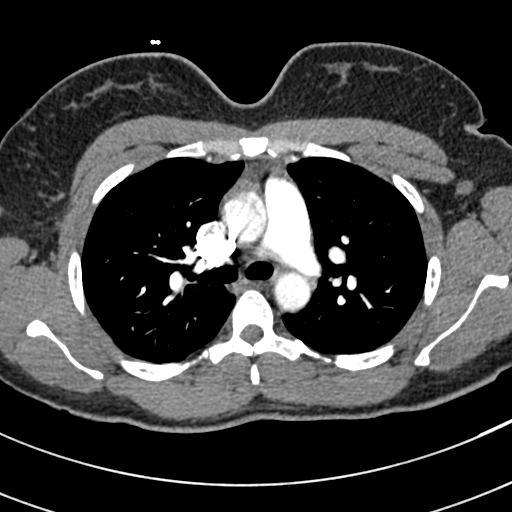
[im 221/283  lung]
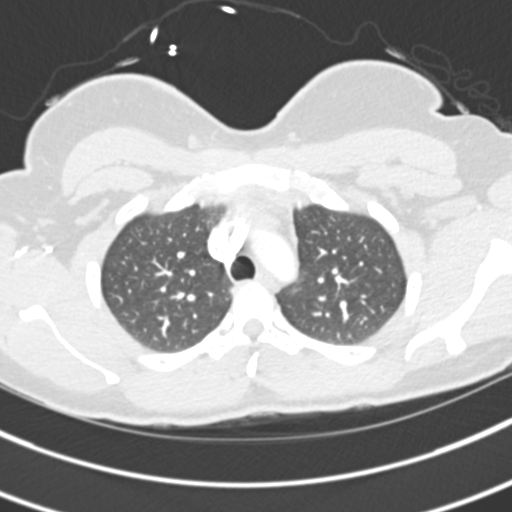
[im 233/283  soft-tissue]
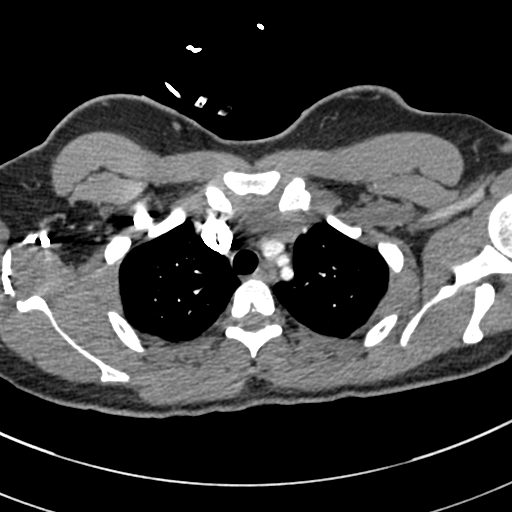
[im 246/283  lung]
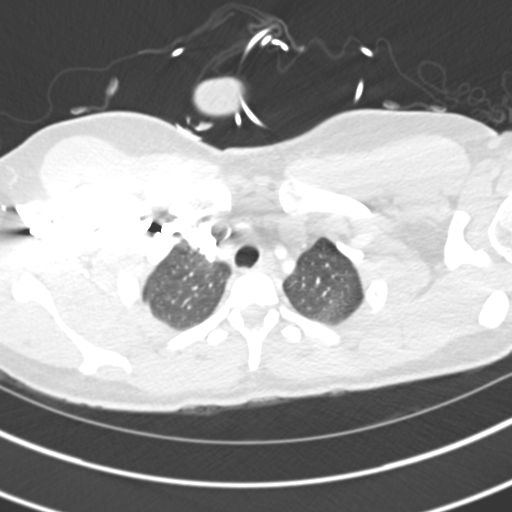
[im 270/283  soft-tissue]
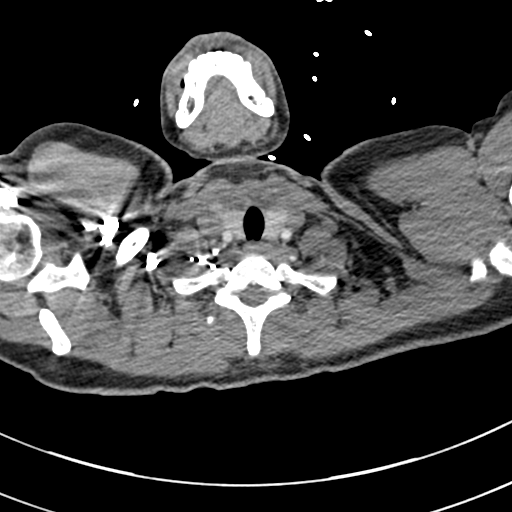

[Series 9: coronal mpr · coronal · 0.57mm/px · 3 of 136 slices shown]
[im 34/136  soft-tissue]
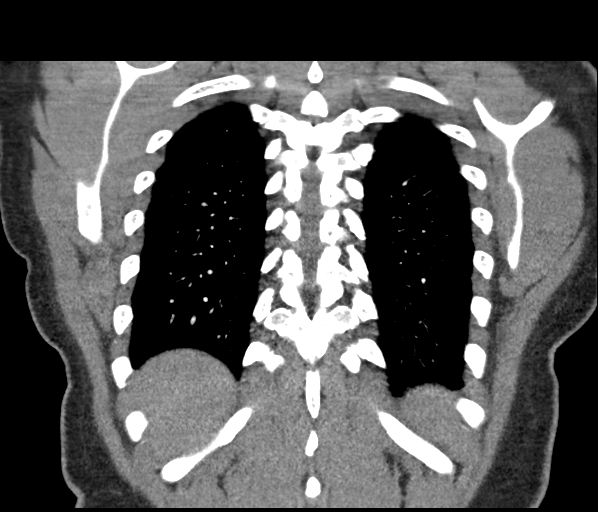
[im 68/136  soft-tissue]
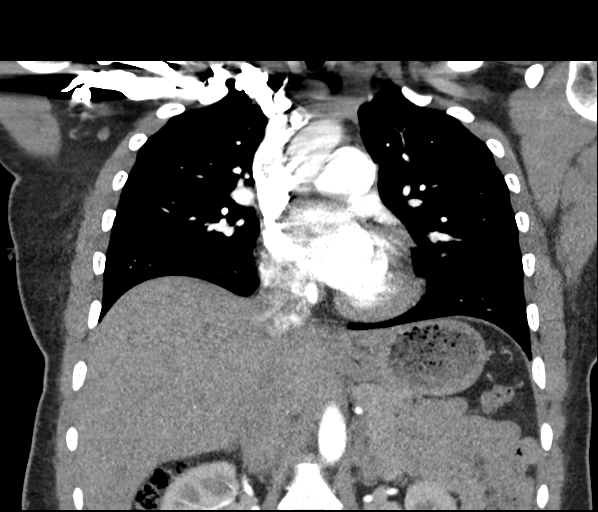
[im 102/136  soft-tissue]
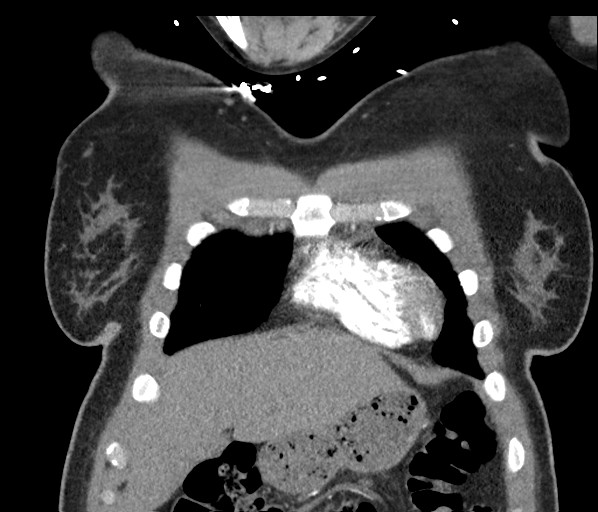

[19 of 46 positions shown; findings below may reference images not displayed]

FINDINGS: Cardiovascular: Pulmonary arterial opacification is good. No
pulmonary emboli. The aorta is normal. No coronary artery
calcification. Heart size is normal. No pericardial fluid.

Mediastinum/Nodes: Normal

Lungs/Pleura: Normal

Upper Abdomen: Normal

Musculoskeletal: Normal

Review of the MIP images confirms the above findings.
IMPRESSION: Normal examination. No pulmonary emboli. No other abnormal chest
finding to explain the patient's symptoms.

## 2018-06-10 ENCOUNTER — Ambulatory Visit: Payer: Medicaid Other | Admitting: Advanced Practice Midwife

## 2018-06-11 ENCOUNTER — Encounter: Payer: Self-pay | Admitting: Advanced Practice Midwife

## 2018-06-11 ENCOUNTER — Ambulatory Visit (INDEPENDENT_AMBULATORY_CARE_PROVIDER_SITE_OTHER): Payer: Medicaid Other | Admitting: Advanced Practice Midwife

## 2018-06-11 DIAGNOSIS — Z86718 Personal history of other venous thrombosis and embolism: Secondary | ICD-10-CM

## 2018-06-11 DIAGNOSIS — D6859 Other primary thrombophilia: Secondary | ICD-10-CM | POA: Insufficient documentation

## 2018-06-11 NOTE — Progress Notes (Signed)
Brandy Barker is a 27 y.o. who presents for a postpartum visit. She is 6 weeks postpartum following a low cervical transverse Cesarean section. I have fully reviewed the prenatal and intrapartum course. The delivery was at 39 gestational weeks, repeat CX.  Anesthesia: spinal. Postpartum course has been uneventful. She had been on lovenox  d/t hx DVT w/protein S deficiency. Says hematologist said it would be lifelong; however, this provider is in [place] and she hasn't seen him in 2 years.  Wants to find a local MD Baby's course has been uneventful. Baby is feeding by breast. Bleeding: staining only. Bowel function is normal. Bladder function is normal. Patient is not sexually active. Contraception method is abstinence. Postpartum depression screening: negative.   Current Outpatient Medications:  .  enoxaparin (LOVENOX) 40 MG/0.4ML injection, Inject 0.4 mLs (40 mg total) into the skin daily., Disp: 30 Syringe, Rfl: 5 .  ibuprofen (ADVIL,MOTRIN) 600 MG tablet, Take 1 tablet (600 mg total) by mouth every 6 (six) hours as needed for moderate pain. (Patient not taking: Reported on 06/11/2018), Disp: 30 tablet, Rfl: 0 .  Prenatal Vit-Fe Fumarate-FA (MULTIVITAMIN-PRENATAL) 27-0.8 MG TABS tablet, Take 1 tablet by mouth daily. , Disp: , Rfl:   Review of Systems   Constitutional: Negative for fever and chills Eyes: Negative for visual disturbances Respiratory: Negative for shortness of breath, dyspnea Cardiovascular: Negative for chest pain or palpitations  Gastrointestinal: Negative for vomiting, diarrhea and constipation Genitourinary: Negative for dysuria and urgency Musculoskeletal: Negative for back pain, joint pain, myalgias  Neurological: Negative for dizziness and headaches    Objective:     Vitals:   06/11/18 1339  BP: 117/75  Pulse: 72   General:  alert, cooperative and no distress   Breasts:  negative  Lungs: Normal respiratory effort  Heart:  regular rate and rhythm  Abdomen: Soft,  nontender.  Well healed incision                          Assessment:    normal postpartum exam.  Plan:   1. Contraception: Nexplanon 2. Follow up in: 3 weeks--abstinence until then 3. Referral to [facility] (local hematologist) sent

## 2018-07-02 ENCOUNTER — Encounter: Payer: Self-pay | Admitting: Advanced Practice Midwife

## 2018-07-02 ENCOUNTER — Ambulatory Visit (INDEPENDENT_AMBULATORY_CARE_PROVIDER_SITE_OTHER): Payer: Medicaid Other | Admitting: Advanced Practice Midwife

## 2018-07-02 VITALS — BP 113/72 | HR 76 | Ht 63.0 in | Wt 188.0 lb

## 2018-07-02 DIAGNOSIS — Z3049 Encounter for surveillance of other contraceptives: Secondary | ICD-10-CM

## 2018-07-02 DIAGNOSIS — Z3202 Encounter for pregnancy test, result negative: Secondary | ICD-10-CM | POA: Diagnosis not present

## 2018-07-02 DIAGNOSIS — Z30017 Encounter for initial prescription of implantable subdermal contraceptive: Secondary | ICD-10-CM | POA: Insufficient documentation

## 2018-07-02 LAB — POCT URINE PREGNANCY: Preg Test, Ur: NEGATIVE

## 2018-07-02 MED ORDER — ETONOGESTREL 68 MG ~~LOC~~ IMPL
68.0000 mg | DRUG_IMPLANT | Freq: Once | SUBCUTANEOUS | Status: AC
  Administered 2018-07-02: 68 mg via SUBCUTANEOUS

## 2018-07-02 NOTE — Addendum Note (Signed)
Addended by: Colen Darling on: 07/02/2018 09:41 AM   Modules accepted: Orders

## 2018-07-02 NOTE — Progress Notes (Signed)
  HPI:  Brandy Barker is a 27 y.o. year old African American female here for Nexplanon insertion.  She hasn't had sex since delivery and her pregnancy test today was negative.  Risks/benefits/side effects of Nexplanon have been discussed and her questions have been answered.  Specifically, a failure rate of 09/998 has been reported, with an increased failure rate if pt takes St. John's Wort and/or antiseizure medicaitons.  Brandy Barker is aware of the common side effect of irregular bleeding, which the incidence of decreases over time.   Past Medical History: Past Medical History:  Diagnosis Date  . DVT (deep venous thrombosis) (HCC)   . Protein S deficiency (HCC) 2016    Past Surgical History: Past Surgical History:  Procedure Laterality Date  . CESAREAN SECTION    . CESAREAN SECTION Bilateral 04/15/2018   Procedure: REPEAT CESAREAN SECTION;  Surgeon: Tilda Burrow, MD;  Location: [facility] BIRTHING SUITES;  Service: Obstetrics;  Laterality: Bilateral;  . cesearan      Family History: Family History  Problem Relation Age of Onset  . Cancer Paternal Grandfather   . Diabetes Paternal Grandmother   . Cancer Paternal Grandmother   . Hypertension Father   . Other Sister        DVT    Social History: Social History   Tobacco Use  . Smoking status: Never Smoker  . Smokeless tobacco: Never Used  Substance Use Topics  . Alcohol use: No  . Drug use: No    Allergies: No Known Allergies    Her left arm, approximatly 4 inches proximal from the elbow, was cleansed with alcohol and anesthetized with 2cc of 2% Lidocaine.  The area was cleansed again and the Nexplanon was inserted without difficulty.  A pressure bandage was applied.  Pt was instructed to remove pressure bandage in a few hours, and keep insertion site covered with a bandaid for 3 days.  Back up contraception was recommended for 2 weeks.  Follow-up scheduled PRN problems  [name] 07/02/2018 9:36 AM

## 2020-07-04 ENCOUNTER — Ambulatory Visit: Payer: Medicaid Other | Admitting: Family Medicine

## 2020-07-18 ENCOUNTER — Other Ambulatory Visit: Payer: Self-pay

## 2020-07-18 ENCOUNTER — Ambulatory Visit: Payer: Medicaid Other | Admitting: Family Medicine

## 2020-07-19 ENCOUNTER — Encounter: Payer: Self-pay | Admitting: Nurse Practitioner

## 2020-07-19 ENCOUNTER — Ambulatory Visit (INDEPENDENT_AMBULATORY_CARE_PROVIDER_SITE_OTHER): Payer: 59 | Admitting: Nurse Practitioner

## 2020-07-19 ENCOUNTER — Encounter: Payer: Self-pay | Admitting: Family Medicine

## 2020-07-19 VITALS — BP 137/84 | HR 90 | Temp 98.9°F | Resp 18 | Ht 63.0 in | Wt 223.0 lb

## 2020-07-19 DIAGNOSIS — Z1159 Encounter for screening for other viral diseases: Secondary | ICD-10-CM

## 2020-07-19 DIAGNOSIS — Z0000 Encounter for general adult medical examination without abnormal findings: Secondary | ICD-10-CM | POA: Diagnosis not present

## 2020-07-19 NOTE — Patient Instructions (Addendum)
New patient Visit -will get labs and routine physical in 1 week -PAP due March 2022   Has Hx. Of DVT and Protein S deficiency -no longer taking lovenox shots -first DVT was after the birth of her first child -she had a second DVT about 4 years ago, and there are no know provoking factors -since she hasn't had anticoagulation in 4 years, will consider this if she has recurrence of DVT  Right Arm Pain -no issues today -has aching on occasion, this may be provoked by repetative motions -negative Phalen's Test today -try OTC tylenol or ibuprofen when this occurs; if no improvement, ret urn to clinic

## 2020-07-19 NOTE — Progress Notes (Signed)
New Patient Office Visit  Subjective:  Patient ID: Brandy Barker, female    DOB: 07/03/1991  Age: 29 y.o. MRN: [id number]  CC:  Chief Complaint  Patient presents with  . New Patient (Initial Visit)    HPI Brandy Barker presents for new patient visit. Transferring care from Dr. Kirk Ruths in [place] at [facility]. Last physical and labs were completed several years ago.  Past Medical History:  Diagnosis Date  . DVT (deep venous thrombosis) ([place])   . Protein S deficiency ([place]) 2016    Past Surgical History:  Procedure Laterality Date  . CESAREAN SECTION    . CESAREAN SECTION Bilateral 04/15/2018   Procedure: REPEAT CESAREAN SECTION;  Surgeon: Jonnie Kind, MD;  Location: [place];  Service: Obstetrics;  Laterality: Bilateral;  . CESAREAN SECTION N/A    Phreesia 07/18/2020  . cesearan      Family History  Problem Relation Age of Onset  . Cancer Paternal Grandfather   . Diabetes Paternal Grandmother   . Cancer Paternal Grandmother   . Hypertension Father   . Other Sister        DVT    Social History   Socioeconomic History  . Marital status: Single    Spouse name: Not on file  . Number of children: Not on file  . Years of education: Not on file  . Highest education level: Not on file  Occupational History  . Occupation: Research scientist (life sciences): [employer]  Tobacco Use  . Smoking status: Never Smoker  . Smokeless tobacco: Never Used  Vaping Use  . Vaping Use: Never used  Substance and Sexual Activity  . Alcohol use: No  . Drug use: No  . Sexual activity: Not Currently    Birth control/protection: None  Other Topics Concern  . Not on file  Social History Narrative  . Not on file   Social Determinants of Health   Financial Resource Strain:   . Difficulty of Paying Living Expenses: Not on file  Food Insecurity:   . Worried About Charity fundraiser in the Last Year: Not on file  . Ran Out of Food in the Last Year: Not on file   Transportation Needs:   . Lack of Transportation (Medical): Not on file  . Lack of Transportation (Non-Medical): Not on file  Physical Activity:   . Days of Exercise per Week: Not on file  . Minutes of Exercise per Session: Not on file  Stress:   . Feeling of Stress : Not on file  Social Connections:   . Frequency of Communication with Friends and Family: Not on file  . Frequency of Social Gatherings with Friends and Family: Not on file  . Attends Religious Services: Not on file  . Active Member of Clubs or Organizations: Not on file  . Attends Archivist Meetings: Not on file  . Marital Status: Not on file  Intimate Partner Violence:   . Fear of Current or Ex-Partner: Not on file  . Emotionally Abused: Not on file  . Physically Abused: Not on file  . Sexually Abused: Not on file    ROS Review of Systems  Respiratory: Negative for cough, shortness of breath and wheezing.   Cardiovascular: Positive for leg swelling. Negative for chest pain and palpitations.       Leg swelling on occasion; no issues today  Musculoskeletal: Positive for arthralgias.       Right wrist pain occasionally; worse with  cooking and repetitive motions     Objective:   Today's Vitals: BP 137/84   Pulse 90   Temp 98.9 F (37.2 C)   Resp 18   Ht 5' 3"  (1.6 m)   Wt 223 lb (101.2 kg)   SpO2 97%   BMI 39.50 kg/m   Physical Exam Constitutional:      Appearance: Normal appearance.  Cardiovascular:     Rate and Rhythm: Normal rate and regular rhythm.  Pulmonary:     Effort: Pulmonary effort is normal.     Breath sounds: Normal breath sounds.  Musculoskeletal:        General: No swelling, tenderness or signs of injury.     Comments: Negative Phalen's test  Neurological:     Mental Status: She is alert.     Assessment & Plan:   Problem List Items Addressed This Visit    None     New patient Visit -will get labs and routine physical in 1 week -PAP due March 2022 -she will  bring COVID vaccine cards with her next week   Has Hx. Of DVT and Protein S deficiency -no longer taking lovenox shots -first DVT was after the birth of her first child -she had a second DVT about 4 years ago, and there are no know provoking factors -since she hasn't had anticoagulation in 4 years, will consider this if she has recurrence of DVT  Right Arm Pain -no issues today -has aching on occasion, this may be provoked by repetitive motions -negative Phalen's Test today -try OTC tylenol or ibuprofen when this occurs; if no improvement, ret urn to clinic   Outpatient Encounter Medications as of 07/19/2020  Medication Sig  . ferrous sulfate 325 (65 FE) MG tablet Take 325 mg by mouth daily with breakfast.  . vitamin B-12 (CYANOCOBALAMIN) 100 MCG tablet Take 100 mcg by mouth daily.  Marland Kitchen enoxaparin (LOVENOX) 40 MG/0.4ML injection Inject 0.4 mLs (40 mg total) into the skin daily. (Patient not taking: Reported on 07/19/2020)  . Prenatal Vit-Fe Fumarate-FA (MULTIVITAMIN-PRENATAL) 27-0.8 MG TABS tablet Take 1 tablet by mouth daily.  (Patient not taking: Reported on 07/19/2020)   No facility-administered encounter medications on file as of 07/19/2020.    Follow-up: Return in about 1 week (around 07/26/2020) for Annual Physical Exam.   Noreene Larsson, NP

## 2020-07-26 ENCOUNTER — Other Ambulatory Visit: Payer: Self-pay

## 2020-07-26 ENCOUNTER — Encounter: Payer: Self-pay | Admitting: Nurse Practitioner

## 2020-07-26 ENCOUNTER — Encounter: Payer: 59 | Admitting: Nurse Practitioner

## 2020-07-26 ENCOUNTER — Ambulatory Visit (INDEPENDENT_AMBULATORY_CARE_PROVIDER_SITE_OTHER): Payer: 59 | Admitting: Nurse Practitioner

## 2020-07-26 VITALS — BP 131/84 | HR 66 | Temp 98.5°F | Resp 20 | Ht 63.0 in | Wt 224.0 lb

## 2020-07-26 DIAGNOSIS — D6859 Other primary thrombophilia: Secondary | ICD-10-CM

## 2020-07-26 DIAGNOSIS — Z86718 Personal history of other venous thrombosis and embolism: Secondary | ICD-10-CM | POA: Diagnosis not present

## 2020-07-26 DIAGNOSIS — M79601 Pain in right arm: Secondary | ICD-10-CM | POA: Diagnosis not present

## 2020-07-26 DIAGNOSIS — Z0000 Encounter for general adult medical examination without abnormal findings: Secondary | ICD-10-CM | POA: Diagnosis not present

## 2020-07-26 DIAGNOSIS — Z1159 Encounter for screening for other viral diseases: Secondary | ICD-10-CM | POA: Diagnosis not present

## 2020-07-26 NOTE — Patient Instructions (Signed)
We will draw labs for your physical today.  I expect that we will not need to meet back up for a year, but if any of the labs need to be addressed we will be in touch and get back together sooner.

## 2020-07-26 NOTE — Progress Notes (Signed)
Established Patient Office Visit  Subjective:  Patient ID: Brandy Barker, female    DOB: [birth year]  Age: 29 y.o. MRN: [id number]  CC:  Chief Complaint  Patient presents with  . Annual Exam    HPI Brandy Barker presents for routine physical exam. No acute issues today.  Past Medical History:  Diagnosis Date  . DVT (deep venous thrombosis) (HCC)   . Protein S deficiency (HCC) 2016    Past Surgical History:  Procedure Laterality Date  . CESAREAN SECTION    . CESAREAN SECTION Bilateral 04/15/2018   Procedure: REPEAT CESAREAN SECTION;  Surgeon: Tilda Burrow, MD;  Location: [facility] BIRTHING SUITES;  Service: Obstetrics;  Laterality: Bilateral;  . CESAREAN SECTION N/A    Phreesia 07/18/2020  . cesearan      Family History  Problem Relation Age of Onset  . Cancer Paternal Grandfather   . Diabetes Paternal Grandmother   . Cancer Paternal Grandmother   . Hypertension Father   . Other Sister        DVT    Social History   Socioeconomic History  . Marital status: Single    Spouse name: Not on file  . Number of children: Not on file  . Years of education: Not on file  . Highest education level: Not on file  Occupational History  . Occupation: Curator: [employer]  Tobacco Use  . Smoking status: Never Smoker  . Smokeless tobacco: Never Used  Vaping Use  . Vaping Use: Never used  Substance and Sexual Activity  . Alcohol use: No  . Drug use: No  . Sexual activity: Not Currently    Birth control/protection: None  Other Topics Concern  . Not on file  Social History Narrative  . Not on file   Social Determinants of Health   Financial Resource Strain:   . Difficulty of Paying Living Expenses: Not on file  Food Insecurity:   . Worried About Programme researcher, broadcasting/film/video in the Last Year: Not on file  . Ran Out of Food in the Last Year: Not on file  Transportation Needs:   . Lack of Transportation (Medical): Not on file  . Lack of Transportation  (Non-Medical): Not on file  Physical Activity:   . Days of Exercise per Week: Not on file  . Minutes of Exercise per Session: Not on file  Stress:   . Feeling of Stress : Not on file  Social Connections:   . Frequency of Communication with Friends and Family: Not on file  . Frequency of Social Gatherings with Friends and Family: Not on file  . Attends Religious Services: Not on file  . Active Member of Clubs or Organizations: Not on file  . Attends Banker Meetings: Not on file  . Marital Status: Not on file  Intimate Partner Violence:   . Fear of Current or Ex-Partner: Not on file  . Emotionally Abused: Not on file  . Physically Abused: Not on file  . Sexually Abused: Not on file    Outpatient Medications Prior to Visit  Medication Sig Dispense Refill  . ferrous sulfate 325 (65 FE) MG tablet Take 325 mg by mouth daily with breakfast.    . vitamin B-12 (CYANOCOBALAMIN) 100 MCG tablet Take 100 mcg by mouth daily.    Marland Kitchen enoxaparin (LOVENOX) 40 MG/0.4ML injection Inject 0.4 mLs (40 mg total) into the skin daily. 30 Syringe 5  . Prenatal Vit-Fe Fumarate-FA (MULTIVITAMIN-PRENATAL) 27-0.8 MG TABS  tablet Take 1 tablet by mouth daily.      No facility-administered medications prior to visit.    No Known Allergies  ROS Review of Systems  Constitutional: Negative.   HENT: Negative.   Eyes: Negative.   Respiratory: Negative.   Cardiovascular: Negative.   Gastrointestinal: Negative.   Endocrine: Negative.   Genitourinary: Negative.   Musculoskeletal: Negative.   Skin: Negative.   Neurological: Negative.   Hematological: Negative.   Psychiatric/Behavioral: Negative.       Objective:    Physical Exam Constitutional:      Appearance: Normal appearance.  HENT:     Head: Normocephalic and atraumatic.     Right Ear: Tympanic membrane, ear canal and external ear normal.     Left Ear: Tympanic membrane, ear canal and external ear normal.     Nose: Nose normal.      Mouth/Throat:     Mouth: Mucous membranes are moist.     Pharynx: Oropharynx is clear.  Eyes:     Extraocular Movements: Extraocular movements intact.     Conjunctiva/sclera: Conjunctivae normal.     Pupils: Pupils are equal, round, and reactive to light.  Cardiovascular:     Rate and Rhythm: Normal rate and regular rhythm.     Pulses: Normal pulses.     Heart sounds: Normal heart sounds.  Pulmonary:     Effort: Pulmonary effort is normal.     Breath sounds: Normal breath sounds.  Abdominal:     General: Bowel sounds are normal.     Palpations: Abdomen is soft.  Musculoskeletal:        General: Normal range of motion.     Cervical back: Normal range of motion and neck supple.  Skin:    General: Skin is warm and dry.     Capillary Refill: Capillary refill takes less than 2 seconds.  Neurological:     General: No focal deficit present.     Mental Status: She is alert and oriented to person, place, and time.  Psychiatric:        Mood and Affect: Mood normal.        Behavior: Behavior normal.     BP 131/84   Pulse 66   Temp 98.5 F (36.9 C)   Resp 20   Ht 5\' 3"  (1.6 m)   Wt 224 lb (101.6 kg)   SpO2 98%   BMI 39.68 kg/m  Wt Readings from Last 3 Encounters:  07/26/20 224 lb (101.6 kg)  07/19/20 223 lb (101.2 kg)  07/02/18 188 lb (85.3 kg)     Health Maintenance Due  Topic Date Due  . Hepatitis C Screening  Never done  . COVID-19 Vaccine (1) Never done    There are no preventive care reminders to display for this patient.  No results found for: TSH Lab Results  Component Value Date   WBC 10.3 04/16/2018   HGB 10.2 (L) 04/16/2018   HCT 31.3 (L) 04/16/2018   MCV 78.1 04/16/2018   PLT 213 04/16/2018   Lab Results  Component Value Date   NA 133 (L) 09/02/2017   K 3.4 (L) 09/02/2017   CO2 22 09/02/2017   GLUCOSE 123 (H) 09/02/2017   BUN 10 09/02/2017   CREATININE 0.70 09/02/2017   BILITOT 0.6 08/05/2017   ALKPHOS 40 08/05/2017   AST 21 08/05/2017   ALT  20 08/05/2017   PROT 7.7 08/05/2017   ALBUMIN 3.8 08/05/2017   CALCIUM 8.4 (L) 09/02/2017   ANIONGAP  8 09/02/2017   No results found for: CHOL No results found for: HDL No results found for: LDLCALC No results found for: TRIG No results found for: CHOLHDL No results found for: BXID5W    Assessment & Plan:   Problem List Items Addressed This Visit      Hematopoietic and Hemostatic   Protein S deficiency (HCC) - Primary     Other   History of DVT (deep vein thrombosis)    Other Visit Diagnoses    Right arm pain          No orders of the defined types were placed in this encounter.  Has Hx. Of DVT and Protein S deficiency -no longer taking lovenox shots -first DVT was after the birth of her first child -she had a second DVT about 4 years ago, and there are no know provoking factors -since she hasn't had anticoagulation in 4 years, will consider this if she has recurrence of DVT  Right Arm Pain -no issues today -has aching on occasion, this may be provoked by repetative motions -negative Phalen's Test on last exam (a week ago) -try OTC tylenol or ibuprofen when this occurs; if no improvement, return to clinic  Follow-up: No follow-ups on file.    Heather Roberts, NP

## 2020-07-27 LAB — CMP14+EGFR
ALT: 26 IU/L (ref 0–32)
AST: 18 IU/L (ref 0–40)
Albumin/Globulin Ratio: 1.3 (ref 1.2–2.2)
Albumin: 4.3 g/dL (ref 3.8–4.8)
Alkaline Phosphatase: 48 IU/L (ref 44–121)
BUN/Creatinine Ratio: 14 (ref 9–23)
BUN: 12 mg/dL (ref 6–20)
Bilirubin Total: 0.4 mg/dL (ref 0.0–1.2)
CO2: 21 mmol/L (ref 20–29)
Calcium: 9.5 mg/dL (ref 8.7–10.2)
Chloride: 104 mmol/L (ref 96–106)
Creatinine, Ser: 0.83 mg/dL (ref 0.57–1.00)
GFR calc Af Amer: 109 mL/min/{1.73_m2} (ref >59)
GFR calc non Af Amer: 94 mL/min/{1.73_m2} (ref >59)
Globulin, Total: 3.3 g/dL (ref 1.5–4.5)
Glucose: 82 mg/dL (ref 65–99)
Potassium: 4.1 mmol/L (ref 3.5–5.2)
Sodium: 138 mmol/L (ref 134–144)
Total Protein: 7.6 g/dL (ref 6.0–8.5)

## 2020-07-27 LAB — CBC WITH DIFFERENTIAL/PLATELET
Basophils Absolute: 0 10*3/uL (ref 0.0–0.2)
Basos: 0 %
EOS (ABSOLUTE): 0.3 10*3/uL (ref 0.0–0.4)
Eos: 5 %
Hematocrit: 39.6 % (ref 34.0–46.6)
Hemoglobin: 12.5 g/dL (ref 11.1–15.9)
Immature Grans (Abs): 0 10*3/uL (ref 0.0–0.1)
Immature Granulocytes: 0 %
Lymphocytes Absolute: 2.8 10*3/uL (ref 0.7–3.1)
Lymphs: 42 %
MCH: 26.5 pg — ABNORMAL LOW (ref 26.6–33.0)
MCHC: 31.6 g/dL (ref 31.5–35.7)
MCV: 84 fL (ref 79–97)
Monocytes Absolute: 0.6 10*3/uL (ref 0.1–0.9)
Monocytes: 9 %
Neutrophils Absolute: 2.9 10*3/uL (ref 1.4–7.0)
Neutrophils: 44 %
Platelets: 263 10*3/uL (ref 150–450)
RBC: 4.72 x10E6/uL (ref 3.77–5.28)
RDW: 14.6 % (ref 11.7–15.4)
WBC: 6.7 10*3/uL (ref 3.4–10.8)

## 2020-07-27 LAB — LIPID PANEL WITH LDL/HDL RATIO
Cholesterol, Total: 167 mg/dL (ref 100–199)
HDL: 46 mg/dL (ref >39)
LDL Chol Calc (NIH): 109 mg/dL — ABNORMAL HIGH (ref 0–99)
LDL/HDL Ratio: 2.4 ratio (ref 0.0–3.2)
Triglycerides: 63 mg/dL (ref 0–149)
VLDL Cholesterol Cal: 12 mg/dL (ref 5–40)

## 2020-07-27 LAB — HEPATITIS C ANTIBODY: Hep C Virus Ab: <0.1 s/co ratio (ref 0.0–0.9)

## 2020-09-29 ENCOUNTER — Encounter: Payer: Self-pay | Admitting: Nurse Practitioner

## 2020-09-29 ENCOUNTER — Other Ambulatory Visit: Payer: Self-pay

## 2020-09-29 ENCOUNTER — Telehealth (INDEPENDENT_AMBULATORY_CARE_PROVIDER_SITE_OTHER): Payer: 59 | Admitting: Nurse Practitioner

## 2020-09-29 DIAGNOSIS — J029 Acute pharyngitis, unspecified: Secondary | ICD-10-CM | POA: Insufficient documentation

## 2020-09-29 LAB — POCT RAPID STREP A (OFFICE): Rapid Strep A Screen: NEGATIVE

## 2020-09-29 MED ORDER — PHENOL 1.4 % MT LIQD: 1.0000 | OROMUCOSAL | 0 refills | Status: DC | PRN

## 2020-09-29 MED ORDER — IBUPROFEN 600 MG PO TABS: 600.0000 mg | ORAL_TABLET | Freq: Three times a day (TID) | ORAL | 0 refills | Status: DC | PRN

## 2020-09-29 NOTE — Assessment & Plan Note (Addendum)
-  ongoing x 1 week -will get strep test after lunch -Rx. Ibuprofen -Rx. Chloraseptic spray -will consider abx based on results of strep test or if symptoms persist over the weekend

## 2020-09-29 NOTE — Progress Notes (Addendum)
Acute Office Visit  Subjective:    Patient ID: Brandy Barker, female    DOB: 12-01-1990, 30 y.o.   MRN: [id number]  Chief Complaint  Patient presents with  . Sore Throat    X 1 week     HPI Patient is in today for sick visit.  She has been using herbal teas and salt water gargles.  She states that she has some headaches, and she had nasal congestion at the beginning of the week.   Past Medical History:  Diagnosis Date  . DVT (deep venous thrombosis) (HCC)   . Protein S deficiency (HCC) 2016    Past Surgical History:  Procedure Laterality Date  . CESAREAN SECTION    . CESAREAN SECTION Bilateral 04/15/2018   Procedure: REPEAT CESAREAN SECTION;  Surgeon: Tilda Burrow, MD;  Location: [facility] BIRTHING SUIT[facility];  Service: Obstetrics;  Laterality: Bilateral;  . CESAREAN SECTION N/A    Phreesia 07/18/2020  . cesearan      Family History  Problem Relation Age of Onset  . Cancer Paternal Grandfather   . Diabetes Paternal Grandmother   . Cancer Paternal Grandmother   . Hypertension Father   . Other Sister        DVT    Social History   Socioeconomic History  . Marital status: Single    Spouse name: Not on file  . Number of children: Not on file  . Years of education: Not on file  . Highest education level: Not on file  Occupational History  . 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 Smoking status: Never Smoker  . Smokeless tobacco: Never Used  Vaping Use  . Vaping Use: Never used  Substance and Sexual Activity  . Alcohol use: No  . Drug use: No  . Sexual activity: Not Currently    Birth control/protection: None  Other Topics Concern  . Not on file  Social History Narrative  . Not on file   Social Determinants of Health   Financial Resource Strain: Not on file  Food Insecurity: Not on file  Transportation Needs: Not on file  Physical Activity: Not on file  Stress: Not on file  Social Connections: Not on file  Intimate Partner  Violence: Not on file    Outpatient Medications Prior to Visit  Medication Sig Dispense Refill  . ferrous sulfate 325 (65 FE) MG tablet Take 325 mg by mouth daily with breakfast.    . vitamin B-12 (CYANOCOBALAMIN) 100 MCG tablet Take 100 mcg by mouth daily.     No facility-administered medications prior to visit.    No Known Allergies  Review of Systems  Constitutional: Negative for chills, fatigue and fever.  HENT: Positive for congestion and sore throat. Negative for rhinorrhea, sinus pressure and sinus pain.   Respiratory: Positive for cough. Negative for shortness of breath and wheezing.   Neurological: Positive for headaches.       Objective:    Physical Exam  There were no vitals taken for this visit. Wt Readings from Last 3 Encounters:  07/26/20 224 lb (101.6 kg)  07/19/20 223 lb (101.2 kg)  07/02/18 188 lb (85.3 kg)    Health Maintenance Due  Topic Date Due  . COVID-19 Vaccine (1) Never done  . PAP SMEAR-Modifier  11/25/2020    There are no preventive care reminders to display for this patient.   No results found for: TSH Lab Results  Component Value Date   WBC  6.7 07/26/2020   HGB 12.5 07/26/2020   HCT 39.6 07/26/2020   MCV 84 07/26/2020   PLT 263 07/26/2020   Lab Results  Component Value Date   NA 138 07/26/2020   K 4.1 07/26/2020   CO2 21 07/26/2020   GLUCOSE 82 07/26/2020   BUN 12 07/26/2020   CREATININE 0.83 07/26/2020   BILITOT 0.4 07/26/2020   ALKPHOS 48 07/26/2020   AST 18 07/26/2020   ALT 26 07/26/2020   PROT 7.6 07/26/2020   ALBUMIN 4.3 07/26/2020   CALCIUM 9.5 07/26/2020   ANIONGAP 8 09/02/2017   Lab Results  Component Value Date   CHOL 167 07/26/2020   Lab Results  Component Value Date   HDL 46 07/26/2020   Lab Results  Component Value Date   LDLCALC 109 (H) 07/26/2020   Lab Results  Component Value Date   TRIG 63 07/26/2020   No results found for: CHOLHDL No results found for: MHDQ2I     Assessment & Plan:    Problem List Items Addressed This Visit      Other   Sore throat    -ongoing x 1 week -will get strep test after lunch -Rx. Ibuprofen -Rx. Chloraseptic spray -will consider abx based on results of strep test or if symptoms persist over the weekend        -Strep test was negative  No orders of the defined types were placed in this encounter.  Date:  09/29/2020   Location of Patient: Home Location of Provider: Office Consent was obtain for visit to be over via telehealth. I verified that I am speaking with the correct person using two identifiers.  I connected with  Brandy Barker on 09/29/20 via telephone and verified that I am speaking with the correct person using two identifiers.   I discussed the limitations of evaluation and management by telemedicine. The patient expressed understanding and agreed to proceed.  Time spent: 9 minutes   Heather Roberts, NP

## 2020-09-29 NOTE — Addendum Note (Signed)
Addended by: Jerilynn Mages on: 09/29/2020 02:52 PM   Modules accepted: Orders

## 2021-06-21 ENCOUNTER — Encounter: Payer: 59 | Admitting: Adult Health

## 2021-06-22 ENCOUNTER — Ambulatory Visit (INDEPENDENT_AMBULATORY_CARE_PROVIDER_SITE_OTHER): Payer: 59 | Admitting: Adult Health

## 2021-06-22 ENCOUNTER — Other Ambulatory Visit: Payer: Self-pay

## 2021-06-22 ENCOUNTER — Encounter: Payer: Self-pay | Admitting: Adult Health

## 2021-06-22 VITALS — BP 112/77 | HR 73 | Ht 63.0 in | Wt 215.0 lb

## 2021-06-22 DIAGNOSIS — Z3202 Encounter for pregnancy test, result negative: Secondary | ICD-10-CM | POA: Diagnosis not present

## 2021-06-22 DIAGNOSIS — Z3046 Encounter for surveillance of implantable subdermal contraceptive: Secondary | ICD-10-CM | POA: Insufficient documentation

## 2021-06-22 LAB — POCT URINE PREGNANCY: Preg Test, Ur: NEGATIVE

## 2021-06-22 MED ORDER — ETONOGESTREL 68 MG ~~LOC~~ IMPL
68.0000 mg | DRUG_IMPLANT | Freq: Once | SUBCUTANEOUS | Status: AC
  Administered 2021-06-22: 68 mg via SUBCUTANEOUS

## 2021-06-22 NOTE — Addendum Note (Signed)
Addended by: Colen Darling on: 06/22/2021 11:05 AM   Modules accepted: Orders

## 2021-06-22 NOTE — Progress Notes (Signed)
  Subjective:     Patient ID: Brandy Barker, female   DOB: 04/11/1991, 30 y.o.   MRN: [id number]  HPI Brandy Barker is a 30 year old black female, getting married, Saturday, [id number], in for nexplanon removal and reinsertion. PCP is Laury Axon NP. Lab Results  Component Value Date   DIAGPAP  11/25/2017    NEGATIVE FOR INTRAEPITHELIAL LESIONS OR MALIGNANCY.    Review of Systems For nexplanon removal and reinsertion Reviewed past medical,surgical, social and family history. Reviewed medications and allergies.     Objective:   Physical Exam BP 112/77 (BP Location: Right Arm, Patient Position: Standing, Cuff Size: Large)   Pulse 73   Ht 5\' 3"  (1.6 m)   Wt 215 lb (97.5 kg)   Breastfeeding No   BMI 38.09 kg/m     UPT is negative.Consent signed and time out called. Left arm cleansed with betadine, and injected with 1.5 cc 2% lidocaine and waited til numb.Under sterile technique a #11 blade was used to make small vertical incision, and a curved forceps was used to easily remove rod. New rod easily inserted and palpated by provider and pt .Steri strips applied. Pressure dressing applied.  Fall risk si low  [facility] - 06/22/21 0925       Pregnancy Intention Screening   Does the patient want to become pregnant in the next year? No    Does the patient's partner want to become pregnant in the next year? No    Would the patient like to discuss contraceptive options today? No      Contraception Wrap Up   Current Method Hormonal Implant    End Method Hormonal Implant             Assessment:     1. Pregnancy examination or test, negative result   2. Encounter for removal and reinsertion of Nexplanon Lot 06/24/21 Exp [id number].    Plan:     - keep clean and dry x 24 hours, no heavy lifting, keep steri strips on x 72 hours, Keep pressure dressing on x 24 hours. Follow up prn problems.    Pap 111/15/22 Physical with PCP 07/26/21

## 2021-06-22 NOTE — Patient Instructions (Signed)
keep clean and dry x 24 hours, no heavy lifting, keep steri strips on x 72 hours, Keep pressure dressing on x 24 hours. Follow up prn problems.  °

## 2021-07-20 ENCOUNTER — Other Ambulatory Visit: Payer: 59 | Admitting: Adult Health

## 2021-07-25 ENCOUNTER — Encounter: Payer: 59 | Admitting: Nurse Practitioner

## 2021-07-26 ENCOUNTER — Encounter: Payer: 59 | Admitting: Nurse Practitioner

## 2021-08-08 ENCOUNTER — Encounter: Payer: 59 | Admitting: Nurse Practitioner

## 2021-08-24 ENCOUNTER — Encounter: Payer: 59 | Admitting: Nurse Practitioner

## 2023-01-08 ENCOUNTER — Ambulatory Visit: Payer: 59 | Admitting: Adult Health

## 2023-01-09 ENCOUNTER — Encounter: Payer: Self-pay | Admitting: Adult Health

## 2023-01-09 ENCOUNTER — Other Ambulatory Visit (HOSPITAL_COMMUNITY)
Admission: RE | Admit: 2023-01-09 | Discharge: 2023-01-09 | Disposition: A | Discharge status: Home / Self Care | Payer: 59 | Source: Ambulatory Visit | Attending: Adult Health | Admitting: Adult Health

## 2023-01-09 ENCOUNTER — Ambulatory Visit (INDEPENDENT_AMBULATORY_CARE_PROVIDER_SITE_OTHER): Payer: 59 | Admitting: Adult Health

## 2023-01-09 VITALS — BP 119/72 | HR 68 | Ht 63.5 in | Wt 223.0 lb

## 2023-01-09 DIAGNOSIS — Z01419 Encounter for gynecological examination (general) (routine) without abnormal findings: Secondary | ICD-10-CM | POA: Insufficient documentation

## 2023-01-09 DIAGNOSIS — Z975 Presence of (intrauterine) contraceptive device: Secondary | ICD-10-CM

## 2023-01-09 NOTE — Progress Notes (Signed)
Patient ID: Brandy Barker, female   DOB: Sep 22, 1990, 32 y.o.   MRN: [id number] History of Present Illness: Brandy Barker is a 31 year old black female, married, [id number] in for a well woman gyn exam and pap. She has nexplanon. 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   Current Medications, Allergies, Past Medical History, Past Surgical History, Family History and Social History were reviewed in [employer] record.     Review of Systems: Patient denies any headaches, hearing loss, fatigue, blurred vision, shortness of breath, chest pain, abdominal pain, problems with bowel movements, urination, or intercourse. No joint pain or mood swings.  Does have decrease in libido    Physical Exam:BP 119/72 (BP Location: Left Arm, Patient Position: Sitting, Cuff Size: Large)   Pulse 68   Ht 5' 3.5" (1.613 m)   Wt 223 lb (101.2 kg)   BMI 38.88 kg/m   General:  Well developed, well nourished, no acute distress Skin:  Warm and dry Neck:  Midline trachea, normal thyroid, good ROM, no lymphadenopathy Lungs; Clear to auscultation bilaterally Breast:  No dominant palpable mass, retraction, or nipple discharge Cardiovascular: Regular rate and rhythm Abdomen:  Soft, non tender, no hepatosplenomegaly Pelvic:  External genitalia is normal in appearance, no lesions.  The vagina is normal in appearance. Urethra has no lesions or masses. The cervix is smooth, pap with HR HPV genotyping performed.  Uterus is felt to be normal size, shape, and contour.  No adnexal masses or tenderness noted.Bladder is non tender, no masses felt. Extremities/musculoskeletal:  No swelling or varicosities noted, no clubbing or cyanosis Psych:  No mood changes, alert and cooperative,seems happy AA is 2 Fall risk is low    01/09/2023    8:54 AM 09/29/2020   11:08 AM 07/26/2020    9:25 AM  Depression screen PHQ 2/9  Decreased Interest 0 0 0  Down, Depressed, Hopeless 0 0 0  PHQ - 2  Score 0 0 0  Altered sleeping 0    Tired, decreased energy 1    Change in appetite 1    Feeling bad or failure about yourself  0    Trouble concentrating 0    Moving slowly or fidgety/restless 0    Suicidal thoughts 0    PHQ-9 Score 2         01/09/2023    8:55 AM  GAD 7 : Generalized Anxiety Score  Nervous, Anxious, on Edge 0  Control/stop worrying 0  Worry too much - different things 0  Trouble relaxing 0  Restless 0  Easily annoyed or irritable 0  Afraid - awful might happen 0  Total GAD 7 Score 0    [facility] - 01/09/23 0843       Pregnancy Intention Screening   Does the patient want to become pregnant in the next year? No    Does the patient's partner want to become pregnant in the next year? No    Would the patient like to discuss contraceptive options today? No      Contraception Wrap Up   Current Method Hormonal Implant    End Method Hormonal Implant            Examination chaperoned by Malachy Mood LPN     Impression and Plan: 1. Encounter for gynecological examination with Papanicolaou smear of cervix Pap sent Pap in 3 years if normal Physical in 1 year  - Cytology - PAP( Brandy Barker)  2. Nexplanon in place Placed 06/22/21 she is thinking about getting a tubal

## 2023-01-11 LAB — CYTOLOGY - PAP
Adequacy: ABSENT
Comment: NEGATIVE
Comment: NEGATIVE
Comment: NEGATIVE
Diagnosis: NEGATIVE
HPV 16: NEGATIVE
HPV 18 / 45: NEGATIVE
High risk HPV: POSITIVE — AB

## 2023-01-14 ENCOUNTER — Other Ambulatory Visit: Payer: Self-pay | Admitting: Adult Health

## 2023-01-14 ENCOUNTER — Encounter: Payer: Self-pay | Admitting: Adult Health

## 2023-01-14 DIAGNOSIS — R87810 Cervical high risk human papillomavirus (HPV) DNA test positive: Secondary | ICD-10-CM | POA: Insufficient documentation

## 2023-01-14 MED ORDER — METRONIDAZOLE 500 MG PO TABS: 500.0000 mg | ORAL_TABLET | Freq: Two times a day (BID) | ORAL | 0 refills | Status: DC

## 2023-01-14 NOTE — Progress Notes (Signed)
+  BV on pap will rx flagyl  

## 2023-01-17 ENCOUNTER — Ambulatory Visit: Payer: Self-pay

## 2023-01-17 ENCOUNTER — Telehealth: Payer: 59 | Admitting: Physician Assistant

## 2023-01-17 DIAGNOSIS — B001 Herpesviral vesicular dermatitis: Secondary | ICD-10-CM | POA: Diagnosis not present

## 2023-01-17 DIAGNOSIS — K1379 Other lesions of oral mucosa: Secondary | ICD-10-CM | POA: Diagnosis not present

## 2023-01-17 MED ORDER — AMOXICILLIN 500 MG PO CAPS: 500.0000 mg | ORAL_CAPSULE | Freq: Two times a day (BID) | ORAL | 0 refills | Status: AC

## 2023-01-17 MED ORDER — VALACYCLOVIR HCL 1 G PO TABS: 2000.0000 mg | ORAL_TABLET | Freq: Two times a day (BID) | ORAL | 0 refills | Status: AC

## 2023-01-17 NOTE — Patient Instructions (Signed)
Janeann Forehand, thank you for joining Margaretann Loveless, PA-C for today's virtual visit.  While this provider is not your primary care provider (PCP), if your PCP is located in our provider database this encounter information will be shared with them immediately following your visit.   A [facility] MyChart account gives you access to today's visit and all your visits, tests, and labs performed at [facility] " click here if you don't have a [facility] MyChart account or go to [email or web address]  Consent: (Patient) Brandy Barker provided verbal consent for this virtual visit at the beginning of the encounter.  Current Medications:  Current Outpatient Medications:    amoxicillin (AMOXIL) 500 MG capsule, Take 1 capsule (500 mg total) by mouth 2 (two) times daily for 5 days., Disp: 10 capsule, Rfl: 0   metroNIDAZOLE (FLAGYL) 500 MG tablet, Take 1 tablet (500 mg total) by mouth 2 (two) times daily., Disp: 14 tablet, Rfl: 0   valACYclovir (VALTREX) 1000 MG tablet, Take 2 tablets (2,000 mg total) by mouth 2 (two) times daily for 1 day., Disp: 4 tablet, Rfl: 0   etonogestrel (NEXPLANON) 68 MG IMPL implant, 1 each by Subdermal route once., Disp: , Rfl:    Ferrous Sulfate (IRON PO), Take by mouth., Disp: , Rfl:    Medications ordered in this encounter:  Meds ordered this encounter  Medications   valACYclovir (VALTREX) 1000 MG tablet    Sig: Take 2 tablets (2,000 mg total) by mouth 2 (two) times daily for 1 day.    Dispense:  4 tablet    Refill:  0    Order Specific Question:   Supervising Provider    Answer:   Merrilee Jansky [[id number]]   amoxicillin (AMOXIL) 500 MG capsule    Sig: Take 1 capsule (500 mg total) by mouth 2 (two) times daily for 5 days.    Dispense:  10 capsule    Refill:  0    Order Specific Question:   Supervising Provider    Answer:   Merrilee Jansky [id number]     *If you need refills on other medications prior to your next appointment, please contact  your pharmacy*  Follow-Up: Call back or seek an in-person evaluation if the symptoms worsen or if the condition fails to improve as anticipated.  [facility] [phone]  Other Instructions Cold Sore  A cold sore, also called a fever blister, is a small, fluid-filled sore that forms inside the mouth or on the lips, gums, nose, chin, or cheeks. Cold sores can spread to other parts of the body, such as the eyes, fingers, or genitals. Cold sores can spread from person to person (are contagious) until the sores crust over completely. Most cold sores go away within 2 weeks. What are the causes? Cold sores are caused by an infection from a common type of herpes simplex virus (HSV-1). HSV-1 is closely related to the HSV-2virus, which is the virus that causes genital herpes, but these viruses are not the same. Once a person is infected with HSV-1, the virus remains permanently in the body. HSV-1 is spread from person to person through close contact, such as through kissing, touching the affected area, or sharing personal items such as lip balm, razors, a drinking glass, or eating utensils. What increases the risk? You are more likely to develop this condition if you: Are tired, stressed, or sick. Are menstruating. Are pregnant. Take certain medicines. Are exposed to cold weather or too much  sun. What are the signs or symptoms? Symptoms of a cold sore outbreak go through different stages. These are the stages of a cold sore: Tingling, itching, or burning is felt 1-2 days before the outbreak. Fluid-filled blisters appear on the lips, inside the mouth, on the nose, or on the cheeks. The blisters start to ooze clear fluid. The blisters dry up, and a yellow crust appears in their place. The crust falls off. In some cases, other symptoms can develop during a cold sore outbreak. These can include: Fever. Sore throat. Headache. Muscle aches. Swollen neck glands. How is this  diagnosed? This condition is diagnosed based on your medical history and a physical exam. Your health care provider may do a blood test or may swab some fluid from your sore and then examine the swab in the lab. How is this treated? There is no cure for cold sores or HSV-1. There is also no vaccine for HSV-1. Most cold sores go away on their own without treatment within 2 weeks. Medicines cannot make the infection go away, but your health care provider may prescribe medicines to: Help relieve some of the pain associated with the sores. Work to stop the virus from multiplying. Shorten healing time. Medicines may be in the form of creams, gels, pills, or a shot. Follow these instructions at home: Medicines Take or apply over-the-counter and prescription medicines only as told by your health care provider. Use a cotton-tip swab to apply creams or gels to your sores. Ask your health care provider if you can take lysine supplements. Research has found that lysine may help heal the cold sore faster and prevent outbreaks. Sore care  Do not touch the sores or pick the scabs. Wash your hands often with soap and water for at least 20 seconds. Do not touch your eyes without washing your hands first. Keep the sores clean and dry. If directed, put ice on the sores. To do this: Put ice in a plastic bag. Place a towel between your skin and the bag. Leave the ice on for 20 minutes, 2-3 times a day. Remove the ice if your skin turns bright red. This is very important. If you cannot feel pain, heat, or cold, you have a greater risk of damage to the area. Eating and drinking Eat a soft, bland diet. Avoid eating hot, cold, or salty foods. Use a straw if it hurts to drink out of a glass. Eat foods that are rich in lysine, such as meat, fish, and dairy products. Avoid sugary foods, chocolates, nuts, and grains. These foods are rich in a nutrient called arginine, which can cause the virus to  multiply. Lifestyle Do not kiss, have oral sex, or share personal items until your sores heal. Stress, poor sleep, and being out in the sun can trigger outbreaks. Make sure you: Do activities that help you relax, such as deep breathing exercises or meditation. Get enough sleep. Apply sunscreen on your lips before you go out in the sun. Contact a health care provider if: You have symptoms for more than 2 weeks. You have pus coming from the sores. You have redness that is spreading. You have pain or irritation in your eye. You get sores on your genitals. Your sores do not heal within 2 weeks. You have frequent cold sore outbreaks. Get help right away if: You have a fever and your symptoms suddenly get worse. You have a headache and confusion. You have tiredness (fatigue) or loss of appetite. You have  a stiff neck or sensitivity to light. Summary A cold sore, also called a fever blister, is a small, fluid-filled sore that forms inside the mouth or on the lips, gums, nose, chin, or cheeks. Most cold sores go away on their own without treatment within 2 weeks. Your health care provider may prescribe medicines to help relieve some of the pain, work to stop the virus from multiplying, and shorten healing time. Wash your hands often with soap and water for at least 20 seconds. Do not touch your eyes without washing your hands first. Do not kiss, have oral sex, or share personal items until your sores heal. Contact a health care provider if your sores do not heal within 2 weeks. This information is not intended to replace advice given to you by your health care provider. Make sure you discuss any questions you have with your health care provider. Document Revised: 05/31/2021 Document Reviewed: 05/31/2021 Elsevier Patient Education  2023 Elsevier Inc.    If you have been instructed to have an in-person evaluation today at a local Urgent Care facility, please use the link below. It will take  you to a list of all of our available [facility], including address, phone number and hours of operation. Please do not delay care.  [facility]  If you or a family member do not have a primary care provider, use the link below to schedule a visit and establish care. When you choose a [facility] primary care physician or advanced practice provider, you gain a long-term partner in health. Find a Primary Care Provider  Learn more about [facility] in-office and virtual care options: [facility] - Get Care Now

## 2023-01-17 NOTE — Progress Notes (Signed)
Virtual Visit Consent   Brandy Barker, you are scheduled for a virtual visit with a [facility] provider today. Just as with appointments in the office, your consent must be obtained to participate. Your consent will be active for this visit and any virtual visit you may have with one of our providers in the next 365 days. If you have a MyChart account, a copy of this consent can be sent to you electronically.  As this is a virtual visit, video technology does not allow for your provider to perform a traditional examination. This may limit your provider's ability to fully assess your condition. If your provider identifies any concerns that need to be evaluated in person or the need to arrange testing (such as labs, EKG, etc.), we will make arrangements to do so. Although advances in technology are sophisticated, we cannot ensure that it will always work on either your end or our end. If the connection with a video visit is poor, the visit may have to be switched to a telephone visit. With either a video or telephone visit, we are not always able to ensure that we have a secure connection.  By engaging in this virtual visit, you consent to the provision of healthcare and authorize for your insurance to be billed (if applicable) for the services provided during this visit. Depending on your insurance coverage, you may receive a charge related to this service.  I need to obtain your verbal consent now. Are you willing to proceed with your visit today? Brandy Barker has provided verbal consent on 01/17/2023 for a virtual visit (video or telephone). Brandy Loveless, PA-C  Date: 01/17/2023 11:16 AM  Virtual Visit via Video Note   I, Brandy Barker, connected with  Brandy Barker  ([id number], Sep 24, 1990) on 01/17/23 at 11:15 AM EDT by a video-enabled telemedicine application and verified that I am speaking with the correct person using two identifiers.  Location: Patient: Virtual Visit Location  Patient: Home Provider: Virtual Visit Location Provider: Home Office   I discussed the limitations of evaluation and management by telemedicine and the availability of in person appointments. The patient expressed understanding and agreed to proceed.    History of Present Illness: Brandy Barker is a 32 y.o. who identifies as a female who was assigned female at birth, and is being seen today for cluster small bumps on upper lip and a cut/sore in mouth. Started with dry patches on upper lip. Started using chapstick. Progressed to small bumps that are mildly itchy. Does have history of genital herpes. Never had cold sores. This is a first time occurrence on lips.   Problems:  Patient Active Problem List   Diagnosis Date Noted   Papanicolaou smear of cervix with positive high risk human papilloma virus (HPV) test 01/14/2023   Encounter for gynecological examination with Papanicolaou smear of cervix 01/09/2023   Encounter for removal and reinsertion of Nexplanon 06/22/2021   Pregnancy examination or test, negative result 06/22/2021   Sore throat 09/29/2020   Nexplanon insertion 07/02/2018   Protein S deficiency (HCC) 06/11/2018   Status post repeat low transverse cesarean section 04/15/2018   Polyhydramnios 12/30/2017   Nexplanon in place 06/25/2016   Postphlebitic syndrome 10/27/2015   Supervision of high risk pregnancy, antepartum 09/25/2013   Previous cesarean section 09/23/2013   History of DVT (deep vein thrombosis) 03/03/2013    Allergies: No Known Allergies Medications:  Current Outpatient Medications:    amoxicillin (AMOXIL) 500 MG capsule, Take 1 capsule (500  mg total) by mouth 2 (two) times daily for 5 days., Disp: 10 capsule, Rfl: 0   metroNIDAZOLE (FLAGYL) 500 MG tablet, Take 1 tablet (500 mg total) by mouth 2 (two) times daily., Disp: 14 tablet, Rfl: 0   valACYclovir (VALTREX) 1000 MG tablet, Take 2 tablets (2,000 mg total) by mouth 2 (two) times daily for 1 day., Disp: 4  tablet, Rfl: 0   etonogestrel (NEXPLANON) 68 MG IMPL implant, 1 each by Subdermal route once., Disp: , Rfl:    Ferrous Sulfate (IRON PO), Take by mouth., Disp: , Rfl:   Observations/Objective: Patient is well-developed, well-nourished in no acute distress.  Resting comfortably at home.  Head is normocephalic, atraumatic.  No labored breathing.  Speech is clear and coherent with logical content.  Patient is alert and oriented at baseline.    Assessment and Plan: 1. Cold sore - valACYclovir (VALTREX) 1000 MG tablet; Take 2 tablets (2,000 mg total) by mouth 2 (two) times daily for 1 day.  Dispense: 4 tablet; Refill: 0  2. Mouth sore - amoxicillin (AMOXIL) 500 MG capsule; Take 1 capsule (500 mg total) by mouth 2 (two) times daily for 5 days.  Dispense: 10 capsule; Refill: 0  - Suspected cold sore - Valtrex prescribed - Amoxicillin for cut in mouth - Salt water gargles - Continue to moisturize lips as needed - Seek further evaluation if not improving or if symptoms worsen  Follow Up Instructions: I discussed the assessment and treatment plan with the patient. The patient was provided an opportunity to ask questions and all were answered. The patient agreed with the plan and demonstrated an understanding of the instructions.  A copy of instructions were sent to the patient via MyChart unless otherwise noted below.    The patient was advised to call back or seek an in-person evaluation if the symptoms worsen or if the condition fails to improve as anticipated.  Time:  I spent 8 minutes with the patient via telehealth technology discussing the above problems/concerns.    Brandy Loveless, PA-C

## 2023-01-23 NOTE — Progress Notes (Signed)
New patient visit   Patient: Brandy Barker   DOB: 11-Dec-1990   32 y.o. Female  MRN: [id number] Visit Date: 01/24/2023  Today's healthcare provider: Ronnald Ramp, MD   No chief complaint on file.  Subjective    Brandy Barker is a 32 y.o. female who presents today as a new patient to establish care.  HPI   Encounter to Establish Care Patient presents to establish care  Introduced myself and my role as primary care physician  We reviewed patient's medical, surgical, and social history and medications as listed below    PMHX   Last annual physical: 07/19/2020   *** Medications: ***   ***  Medications: ***    Social Hx  Tobacco use: *** Alcohol Use : *** Illicit drug use: ***  ***    Concerns for Today:   ***:   Past Medical History:  Diagnosis Date  . DVT (deep venous thrombosis) (HCC)   . Protein S deficiency (HCC) 2016   Past Surgical History:  Procedure Laterality Date  . CESAREAN SECTION    . CESAREAN SECTION Bilateral 04/15/2018   Procedure: REPEAT CESAREAN SECTION;  Surgeon: Tilda Burrow, MD;  Location: [facility] BIRTHING SUITES;  Service: Obstetrics;  Laterality: Bilateral;  . CESAREAN SECTION N/A    Phreesia 07/18/2020  . cesearan     Family Status  Relation Name Status  . PGF  Alive  . PGM  Alive  . MGM  Deceased  . MGF  Deceased  . Father  Alive  . Mother  Alive  . Brother  Alive  . Sister  Alive       Blood Clot  . Sister  Alive  . Son  Alive  . Son  Alive  . Daughter  Alive  . Daughter  Alive   Family History  Problem Relation Age of Onset  . Cancer Paternal Grandfather   . Diabetes Paternal Grandmother   . Cancer Paternal Grandmother   . Hypertension Father   . Other Sister        DVT   Social History   Socioeconomic History  . Marital status: Married    Spouse name: Not on file  . Number of children: Not on file  . Years of education: Not on file  . Highest education level: Not on file  Occupational  History  . Occupation: Curator: [employer]  Tobacco Use  . Smoking status: Never  . Smokeless tobacco: Never  Vaping Use  . Vaping Use: Never used  Substance and Sexual Activity  . Alcohol use: No  . Drug use: No  . Sexual activity: Yes    Birth control/protection: Implant  Other Topics Concern  . Not on file  Social History Narrative  . Not on file   Social Determinants of Health   Financial Resource Strain: Low Risk  (01/09/2023)   Overall Financial Resource Strain (CARDIA)   . Difficulty of Paying Living Expenses: Not hard at all  Food Insecurity: No Food Insecurity (01/09/2023)   Hunger Vital Sign   . Worried About Programme researcher, broadcasting/film/video in the Last Year: Never true   . Ran Out of Food in the Last Year: Never true  Transportation Needs: No Transportation Needs (01/09/2023)   PRAPARE - Transportation   . Lack of Transportation (Medical): No   . Lack of Transportation (Non-Medical): No  Physical Activity: Sufficiently Active (01/09/2023)   Exercise Vital Sign   . Days of Exercise  per Week: 6 days   . Minutes of Exercise per Session: 50 min  Stress: No Stress Concern Present (01/09/2023)   [employer] of Occupational Health - Occupational Stress Questionnaire   . Feeling of Stress : Only a little  Social Connections: Moderately Integrated (01/09/2023)   Social Connection and Isolation Panel [NHANES]   . Frequency of Communication with Friends and Family: Twice a week   . Frequency of Social Gatherings with Friends and Family: Once a week   . Attends Religious Services: 1 to 4 times per year   . Active Member of Clubs or Organizations: No   . Attends Banker Meetings: Never   . Marital Status: Married   Outpatient Medications Prior to Visit  Medication Sig  . metroNIDAZOLE (FLAGYL) 500 MG tablet Take 1 tablet (500 mg total) by mouth 2 (two) times daily.  Marland Kitchen etonogestrel (NEXPLANON) 68 MG IMPL implant 1 each by Subdermal route once.  . Ferrous  Sulfate (IRON PO) Take by mouth.   No facility-administered medications prior to visit.   No Known Allergies  Immunization History  Administered Date(s) Administered  . Influenza-Unspecified 07/13/2020  . Tdap 01/29/2018    Health Maintenance  Topic Date Due  . COVID-19 Vaccine (1) Never done  . INFLUENZA VACCINE  04/04/2023  . PAP SMEAR-Modifier  01/08/2026  . DTaP/Tdap/Td (2 - Td or Tdap) 01/30/2028  . Hepatitis C Screening  Completed  . HIV Screening  Completed  . HPV VACCINES  Aged Out    Patient Care Team: Heather Roberts, NP as PCP - General (Nurse Practitioner)  Review of Systems  {Labs  Heme  Chem  Endocrine  Serology  Results Review (optional):23779}   Objective    There were no vitals taken for this visit. {Show previous vital signs (optional):23777}  Physical Exam ***  Depression Screen    01/09/2023    8:54 AM 09/29/2020   11:08 AM 07/26/2020    9:25 AM 07/19/2020    1:11 PM  PHQ 2/9 Scores  PHQ - 2 Score 0 0 0 0  PHQ- 9 Score 2      No results found for any visits on 01/24/23.  Assessment & Plan     ***  No follow-ups on file.     {provider attestation***:1}   Ronnald Ramp, MD  [facility] [phone] (phone) [phone] (fax)  [facility]

## 2023-01-24 ENCOUNTER — Ambulatory Visit
Admission: RE | Admit: 2023-01-24 | Discharge: 2023-01-24 | Disposition: A | Discharge status: Home / Self Care | Payer: 59 | Source: Ambulatory Visit | Attending: Family Medicine | Admitting: Family Medicine

## 2023-01-24 ENCOUNTER — Encounter: Payer: Self-pay | Admitting: Family Medicine

## 2023-01-24 ENCOUNTER — Ambulatory Visit (INDEPENDENT_AMBULATORY_CARE_PROVIDER_SITE_OTHER): Payer: 59 | Admitting: Family Medicine

## 2023-01-24 VITALS — BP 124/84 | HR 85 | Resp 16 | Ht 68.5 in | Wt 221.3 lb

## 2023-01-24 DIAGNOSIS — D6859 Other primary thrombophilia: Secondary | ICD-10-CM

## 2023-01-24 DIAGNOSIS — L819 Disorder of pigmentation, unspecified: Secondary | ICD-10-CM | POA: Insufficient documentation

## 2023-01-24 DIAGNOSIS — B001 Herpesviral vesicular dermatitis: Secondary | ICD-10-CM | POA: Diagnosis not present

## 2023-01-24 DIAGNOSIS — E611 Iron deficiency: Secondary | ICD-10-CM

## 2023-01-24 DIAGNOSIS — R2241 Localized swelling, mass and lump, right lower limb: Secondary | ICD-10-CM | POA: Diagnosis not present

## 2023-01-24 DIAGNOSIS — Z7689 Persons encountering health services in other specified circumstances: Secondary | ICD-10-CM | POA: Diagnosis not present

## 2023-01-24 DIAGNOSIS — Z86718 Personal history of other venous thrombosis and embolism: Secondary | ICD-10-CM | POA: Diagnosis not present

## 2023-01-24 DIAGNOSIS — M79661 Pain in right lower leg: Secondary | ICD-10-CM

## 2023-01-24 DIAGNOSIS — B009 Herpesviral infection, unspecified: Secondary | ICD-10-CM | POA: Diagnosis not present

## 2023-01-24 MED ORDER — VALACYCLOVIR HCL 500 MG PO TABS
500.0000 mg | ORAL_TABLET | Freq: Every day | ORAL | 1 refills | Status: DC
Start: 2023-01-24 — End: 2023-08-21
  Filled 2023-02-25: qty 88, 88d supply, fill #0
  Filled 2023-02-25: qty 2, 2d supply, fill #0
  Filled 2023-06-01: qty 60, 60d supply, fill #1

## 2023-01-24 NOTE — Assessment & Plan Note (Addendum)
Chronic  No current anticoagulation  She denies having seen heme/onc in the past  Patient reports hx of 2 DVT involving the RLE in the past  Treated with warfain, lovenox in the past  Recommended STAT DVT US today

## 2023-01-24 NOTE — Assessment & Plan Note (Signed)
Welcomed patient to [facility]  Reviewed patient's medical history, medications, surgical and social history Discussed roles and expectations for primary care physician-patient relationship Recommended patient schedule annual preventative examinations   

## 2023-01-24 NOTE — Assessment & Plan Note (Signed)
Chronic  Areas of hyperpigmentation beneath bra line on bilateral flanks  Referral submitted to derm for biopsy

## 2023-01-24 NOTE — Patient Instructions (Addendum)
It was a pleasure meeting you today!  Welcome to [facility]  I look forward to taking part in your care as your new primary care physician.    Summary of our discussion today:   I have placed an order for a STAT ultrasound of your leg to evaluate for recurrent DVT.  I have ordered Valtrex to start taking 500mg  once daily  We will follow up with results of labs once they are available.  Protein S deficiency is hereditary A referral has been placed on your behalf for Dermatology. Our referral coordination team or the office you will be visiting will contact you within the next 2 weeks. If you have not received a phone call within 10 business days please let us know so that we can check into this for you.    Please remember to schedule your annual physical one year from your last physical.   You should return to our clinic in 2 weeks for leg pain follow up.   Best Wishes,   Dr. Roxan Hockey

## 2023-01-24 NOTE — Assessment & Plan Note (Signed)
Chronic  Currently on ferrous sulfate  Will obtain CBC and ferritin levels today  Continue PO iron replacement

## 2023-01-24 NOTE — Assessment & Plan Note (Signed)
Chronic  Intermittent pain and swelling since initially diagnosed with DVT in RLE  Will order stat DVT US today  Discussed DVT clinic referral if imaging is positive

## 2023-01-24 NOTE — Assessment & Plan Note (Signed)
Chronic  Intermittent flares  Will prescribe valtrex 500mg  daily for suppression therapy

## 2023-01-25 ENCOUNTER — Ambulatory Visit
Admission: RE | Admit: 2023-01-25 | Discharge: 2023-01-25 | Disposition: A | Discharge status: Home / Self Care | Payer: 59 | Source: Ambulatory Visit | Attending: Family Medicine | Admitting: Family Medicine

## 2023-01-25 ENCOUNTER — Other Ambulatory Visit: Payer: Self-pay | Admitting: Family Medicine

## 2023-01-25 DIAGNOSIS — I82531 Chronic embolism and thrombosis of right popliteal vein: Secondary | ICD-10-CM

## 2023-01-25 DIAGNOSIS — Z86718 Personal history of other venous thrombosis and embolism: Secondary | ICD-10-CM | POA: Diagnosis not present

## 2023-01-25 DIAGNOSIS — I82431 Acute embolism and thrombosis of right popliteal vein: Secondary | ICD-10-CM | POA: Diagnosis not present

## 2023-01-25 DIAGNOSIS — R2241 Localized swelling, mass and lump, right lower limb: Secondary | ICD-10-CM | POA: Diagnosis not present

## 2023-01-25 DIAGNOSIS — M79661 Pain in right lower leg: Secondary | ICD-10-CM | POA: Insufficient documentation

## 2023-01-25 DIAGNOSIS — D6859 Other primary thrombophilia: Secondary | ICD-10-CM | POA: Diagnosis not present

## 2023-01-25 LAB — COMPREHENSIVE METABOLIC PANEL
ALT: 44 IU/L — ABNORMAL HIGH (ref 0–32)
AST: 31 IU/L (ref 0–40)
Albumin/Globulin Ratio: 1.4 (ref 1.2–2.2)
Albumin: 4.3 g/dL (ref 3.9–4.9)
Alkaline Phosphatase: 50 IU/L (ref 44–121)
BUN/Creatinine Ratio: 12 (ref 9–23)
BUN: 11 mg/dL (ref 6–20)
Bilirubin Total: 0.6 mg/dL (ref 0.0–1.2)
CO2: 22 mmol/L (ref 20–29)
Calcium: 9.5 mg/dL (ref 8.7–10.2)
Chloride: 105 mmol/L (ref 96–106)
Creatinine, Ser: 0.94 mg/dL (ref 0.57–1.00)
Globulin, Total: 3.1 g/dL (ref 1.5–4.5)
Glucose: 83 mg/dL (ref 70–99)
Potassium: 4.5 mmol/L (ref 3.5–5.2)
Sodium: 140 mmol/L (ref 134–144)
Total Protein: 7.4 g/dL (ref 6.0–8.5)
eGFR: 82 mL/min/{1.73_m2} (ref >59)

## 2023-01-25 LAB — CBC
Hematocrit: 43.1 % (ref 34.0–46.6)
Hemoglobin: 13.3 g/dL (ref 11.1–15.9)
MCH: 25.6 pg — ABNORMAL LOW (ref 26.6–33.0)
MCHC: 30.9 g/dL — ABNORMAL LOW (ref 31.5–35.7)
MCV: 83 fL (ref 79–97)
Platelets: 284 10*3/uL (ref 150–450)
RBC: 5.19 x10E6/uL (ref 3.77–5.28)
RDW: 14.7 % (ref 11.7–15.4)
WBC: 5.4 10*3/uL (ref 3.4–10.8)

## 2023-01-25 LAB — FERRITIN: Ferritin: 171 ng/mL — ABNORMAL HIGH (ref 15–150)

## 2023-01-30 ENCOUNTER — Other Ambulatory Visit: Payer: 59

## 2023-01-30 ENCOUNTER — Encounter: Payer: 59 | Admitting: Internal Medicine

## 2023-02-25 ENCOUNTER — Other Ambulatory Visit: Payer: Self-pay

## 2023-06-02 ENCOUNTER — Other Ambulatory Visit: Payer: Self-pay

## 2023-07-24 ENCOUNTER — Ambulatory Visit: Payer: 59 | Admitting: Family Medicine

## 2023-07-24 ENCOUNTER — Other Ambulatory Visit: Payer: Self-pay

## 2023-07-24 ENCOUNTER — Ambulatory Visit
Admission: EM | Admit: 2023-07-24 | Discharge: 2023-07-24 | Disposition: A | Discharge status: Home / Self Care | Payer: 59 | Attending: Emergency Medicine | Admitting: Emergency Medicine

## 2023-07-24 DIAGNOSIS — L03012 Cellulitis of left finger: Secondary | ICD-10-CM | POA: Diagnosis not present

## 2023-07-24 MED ORDER — CEPHALEXIN 500 MG PO CAPS
500.0000 mg | ORAL_CAPSULE | Freq: Four times a day (QID) | ORAL | 0 refills | Status: DC
  Filled 2023-07-24: qty 28, 7d supply, fill #0

## 2023-07-24 MED ORDER — CEPHALEXIN 500 MG PO CAPS: 500.0000 mg | ORAL_CAPSULE | Freq: Four times a day (QID) | ORAL | 0 refills | Status: DC

## 2023-07-24 NOTE — ED Provider Notes (Signed)
Renaldo Fiddler    CSN: [id number] Arrival date & time: 07/24/23  0806      History   Chief Complaint Chief Complaint  Patient presents with   Finger Injury    HPI Brandy Barker is a 32 y.o. female.  Patient presents with left index finger pain, redness, and mild swelling after she had an injury to the area 5 days ago.  She was at work and accidentally punctured her finger on the metal part of an EKG sticker.  She believes it is now infected.  No fever, wound drainage, numbness, weakness, or other symptoms.  No OTC medications today.  Her medical history includes DVT.  The history is provided by the patient and medical records.    Past Medical History:  Diagnosis Date   DVT (deep venous thrombosis) (HCC)    Protein S deficiency (HCC) 2016    Patient Active Problem List   Diagnosis Date Noted   HSV (herpes simplex virus) infection 01/24/2023   Hyperpigmentation 01/24/2023   Iron deficiency 01/24/2023   Establishing care with new doctor, encounter for 01/24/2023   Localized swelling of right lower leg 01/24/2023   Papanicolaou smear of cervix with positive high risk human papilloma virus (HPV) test 01/14/2023   Encounter for gynecological examination with Papanicolaou smear of cervix 01/09/2023   Encounter for removal and reinsertion of Nexplanon 06/22/2021   Pregnancy examination or test, negative result 06/22/2021   Sore throat 09/29/2020   Nexplanon insertion 07/02/2018   Protein S deficiency (HCC) 06/11/2018   Status post repeat low transverse cesarean section 04/15/2018   Polyhydramnios 12/30/2017   Nexplanon in place 06/25/2016   Postphlebitic syndrome 10/27/2015   Supervision of high risk pregnancy, antepartum 09/25/2013   Previous cesarean section 09/23/2013   History of DVT (deep vein thrombosis) 03/03/2013    Past Surgical History:  Procedure Laterality Date   CESAREAN SECTION     CESAREAN SECTION Bilateral 04/15/2018   Procedure: REPEAT CESAREAN  SECTION;  Surgeon: Tilda Burrow, MD;  Location: [facility] BIRTHING SUITES;  Service: Obstetrics;  Laterality: Bilateral;   CESAREAN SECTION N/A    Phreesia 07/18/2020   cesearan      OB History     Gravida  6   Para  4   Term  4   Preterm      AB  2   Living  4      SAB  2   IAB      Ectopic      Multiple  0   Live Births  4            Home Medications    Prior to Admission medications   Medication Sig Start Date End Date Taking? Authorizing Provider  cephALEXin (KEFLEX) 500 MG capsule Take 1 capsule (500 mg total) by mouth 4 (four) times daily. 07/24/23   Mickie Bail, NP  etonogestrel (NEXPLANON) 68 MG IMPL implant 1 each by Subdermal route once.    [provider]  Ferrous Sulfate (IRON PO) Take by mouth. Patient not taking: Reported on 07/24/2023    [provider]  valACYclovir (VALTREX) 500 MG tablet Take 1 tablet (500 mg total) by mouth daily. 01/24/23   Simmons-Robinson, Tawanna Cooler, MD    Family History Family History  Problem Relation Age of Onset   Cancer Paternal Grandfather    Diabetes Paternal Grandmother    Cancer Paternal Grandmother    Hypertension Father    Other Sister  DVT    Social History Social History   Tobacco Use   Smoking status: Never   Smokeless tobacco: Never  Vaping Use   Vaping status: Never Used  Substance Use Topics   Alcohol use: No   Drug use: No     Allergies   Patient has no known allergies.   Review of Systems Review of Systems  Constitutional:  Negative for chills and fever.  Musculoskeletal:  Positive for arthralgias and joint swelling.  Skin:  Positive for color change and wound.  Neurological:  Negative for weakness and numbness.     Physical Exam Triage Vital Signs ED Triage Vitals  Encounter Vitals Group     BP      Systolic BP Percentile      Diastolic BP Percentile      Pulse      Resp      Temp      Temp src      SpO2      Weight      Height      Head  Circumference      Peak Flow      Pain Score      Pain Loc      Pain Education      Exclude from Growth Chart    No data found.  Updated Vital Signs BP 122/78   Pulse 68   Temp 98.4 F (36.9 C)   Resp 18   SpO2 97%   Visual Acuity Right Eye Distance:   Left Eye Distance:   Bilateral Distance:    Right Eye Near:   Left Eye Near:    Bilateral Near:     Physical Exam Constitutional:      General: She is not in acute distress. HENT:     Mouth/Throat:     Mouth: Mucous membranes are moist.  Cardiovascular:     Rate and Rhythm: Normal rate and regular rhythm.  Pulmonary:     Effort: Pulmonary effort is normal. No respiratory distress.  Musculoskeletal:        General: Swelling and tenderness present. No deformity. Normal range of motion.  Skin:    General: Skin is warm and dry.     Capillary Refill: Capillary refill takes less than 2 seconds.     Findings: Erythema present. No lesion.     Comments: Mild erythema and edema of left index finger on dorsum of PIP area.  No open wounds, pus pockets, drainage.  Neurological:     General: No focal deficit present.     Mental Status: She is alert and oriented to person, place, and time.     Sensory: No sensory deficit.     Motor: No weakness.  Psychiatric:        Mood and Affect: Mood normal.        Behavior: Behavior normal.      UC Treatments / Results  Labs (all labs ordered are listed, but only abnormal results are displayed) Labs Reviewed - No data to display  EKG   Radiology No results found.  Procedures Procedures (including critical care time)  Medications Ordered in UC Medications - No data to display  Initial Impression / Assessment and Plan / UC Course  I have reviewed the triage vital signs and the nursing notes.  Pertinent labs & imaging results that were available during my care of the patient were reviewed by me and considered in my medical decision making (see chart for details).  Cellulitis of left index finger.  No I&D indicated at this time.  Treating with Keflex.  Tylenol or ibuprofen as needed for discomfort.  Education provided on cellulitis.  Instructed patient to follow-up with her PCP in 2 to 3 days for recheck of the area.  She agrees to plan of care.  Final Clinical Impressions(s) / UC Diagnoses   Final diagnoses:  Cellulitis of left index finger     Discharge Instructions      Take the cephalexin as directed.  Follow-up with your primary care provider.     ED Prescriptions     Medication Sig Dispense Auth. Provider   cephALEXin (KEFLEX) 500 MG capsule  (Status: Discontinued) Take 1 capsule (500 mg total) by mouth 4 (four) times daily. 28 capsule Wendee Beavers H, NP   cephALEXin (KEFLEX) 500 MG capsule Take 1 capsule (500 mg total) by mouth 4 (four) times daily. 28 capsule Mickie Bail, NP      PDMP not reviewed this encounter.   Mickie Bail, NP 07/24/23 [phone]

## 2023-07-24 NOTE — Discharge Instructions (Addendum)
Take the cephalexin as directed.  Follow up with your primary care provider.    

## 2023-07-24 NOTE — ED Triage Notes (Signed)
Patient to Urgent Care with complaints of left sided index finger pain and swelling.  Symptoms started on Monday. Reports she cut her finger on the metal piece of an EKG sticker.   No drainage. Denies any fevers.

## 2023-07-24 NOTE — Progress Notes (Deleted)
      Established patient visit   Patient: Brandy Barker   DOB: [birth year]   32 y.o. Female  MRN: [id number] Visit Date: 07/24/2023  Today's healthcare provider: Ronnald Ramp, MD   No chief complaint on file.  Subjective       Discussed the use of AI scribe software for clinical note transcription with the patient, who gave verbal consent to proceed.  History of Present Illness             Past Medical History:  Diagnosis Date   DVT (deep venous thrombosis) (HCC)    Protein S deficiency (HCC) 2016    Medications: Outpatient Medications Prior to Visit  Medication Sig   etonogestrel (NEXPLANON) 68 MG IMPL implant 1 each by Subdermal route once.   Ferrous Sulfate (IRON PO) Take by mouth.   valACYclovir (VALTREX) 500 MG tablet Take 1 tablet (500 mg total) by mouth daily.   No facility-administered medications prior to visit.    Review of Systems  Last CBC Lab Results  Component Value Date   WBC 5.4 01/24/2023   HGB 13.3 01/24/2023   HCT 43.1 01/24/2023   MCV 83 01/24/2023   MCH 25.6 (L) 01/24/2023   RDW 14.7 01/24/2023   PLT 284 01/24/2023   Last metabolic panel Lab Results  Component Value Date   GLUCOSE 83 01/24/2023   NA 140 01/24/2023   K 4.5 01/24/2023   CL 105 01/24/2023   CO2 22 01/24/2023   BUN 11 01/24/2023   CREATININE 0.94 01/24/2023   EGFR 82 01/24/2023   CALCIUM 9.5 01/24/2023   PROT 7.4 01/24/2023   ALBUMIN 4.3 01/24/2023   LABGLOB 3.1 01/24/2023   AGRATIO 1.4 01/24/2023   BILITOT 0.6 01/24/2023   ALKPHOS 50 01/24/2023   AST 31 01/24/2023   ALT 44 (H) 01/24/2023   ANIONGAP 8 09/02/2017   Last lipids Lab Results  Component Value Date   CHOL 167 07/26/2020   HDL 46 07/26/2020   LDLCALC 109 (H) 07/26/2020   TRIG 63 07/26/2020   Last hemoglobin A1c No results found for: "HGBA1C" Last thyroid functions No results found for: "TSH", "T3TOTAL", "T4TOTAL", "THYROIDAB"   {See past labs  Heme  Chem  Endocrine   Serology  Results Review (optional):1}   Objective    There were no vitals taken for this visit. BP Readings from Last 3 Encounters:  01/24/23 124/84  01/09/23 119/72  06/22/21 112/77   Wt Readings from Last 3 Encounters:  01/24/23 221 lb 4.8 oz (100.4 kg)  01/09/23 223 lb (101.2 kg)  06/22/21 215 lb (97.5 kg)    {See vitals history (optional):1}      Physical Exam  ***  No results found for any visits on 07/24/23.  Assessment & Plan     Problem List Items Addressed This Visit   None   Assessment and Plan              No follow-ups on file.         Ronnald Ramp, MD  [facility] [phone] (phone) [phone] (fax)  [facility]

## 2023-07-30 ENCOUNTER — Ambulatory Visit: Payer: 59 | Admitting: Family Medicine

## 2023-07-31 ENCOUNTER — Ambulatory Visit: Payer: 59 | Admitting: Family Medicine

## 2023-08-21 ENCOUNTER — Other Ambulatory Visit: Payer: Self-pay | Admitting: Family Medicine

## 2023-08-21 DIAGNOSIS — B009 Herpesviral infection, unspecified: Secondary | ICD-10-CM

## 2023-08-22 ENCOUNTER — Other Ambulatory Visit: Payer: Self-pay

## 2023-08-22 MED ORDER — VALACYCLOVIR HCL 500 MG PO TABS
500.0000 mg | ORAL_TABLET | Freq: Every day | ORAL | 1 refills | Status: DC
Start: 2023-08-22 — End: 2024-03-17
  Filled 2023-08-22: qty 90, 90d supply, fill #0
  Filled 2023-12-04: qty 90, 90d supply, fill #1

## 2024-01-06 ENCOUNTER — Other Ambulatory Visit: Payer: Self-pay

## 2024-01-06 ENCOUNTER — Ambulatory Visit: Admitting: Family Medicine

## 2024-01-06 VITALS — BP 118/72 | HR 73 | Ht 68.0 in | Wt 234.0 lb

## 2024-01-06 DIAGNOSIS — J343 Hypertrophy of nasal turbinates: Secondary | ICD-10-CM

## 2024-01-06 DIAGNOSIS — J0140 Acute pansinusitis, unspecified: Secondary | ICD-10-CM

## 2024-01-06 DIAGNOSIS — H93A1 Pulsatile tinnitus, right ear: Secondary | ICD-10-CM | POA: Diagnosis not present

## 2024-01-06 DIAGNOSIS — R519 Headache, unspecified: Secondary | ICD-10-CM | POA: Insufficient documentation

## 2024-01-06 MED ORDER — FLUTICASONE PROPIONATE 50 MCG/ACT NA SUSP
2.0000 | Freq: Every day | NASAL | 6 refills | Status: AC
  Filled 2024-01-06: qty 16, 30d supply, fill #0
  Filled 2024-02-23: qty 16, 30d supply, fill #1
  Filled 2024-05-26: qty 16, 30d supply, fill #2
  Filled 2024-08-06: qty 16, 30d supply, fill #3
  Filled 2024-09-24: qty 16, 30d supply, fill #4

## 2024-01-06 MED ORDER — AMOXICILLIN 875 MG PO TABS
875.0000 mg | ORAL_TABLET | Freq: Two times a day (BID) | ORAL | 0 refills | Status: AC
Start: 2024-01-06 — End: 2024-01-13
  Filled 2024-01-06: qty 14, 7d supply, fill #0

## 2024-01-06 NOTE — Progress Notes (Signed)
 Established patient visit   Patient: Brandy Barker   DOB: 1991-04-10   33 y.o. Female  MRN: [id number] Visit Date: 01/06/2024  Today's healthcare provider: Mimi Alt, MD   Chief Complaint  Patient presents with   Headache    Off and on headaches for about a month    Subjective     HPI     Headache    Additional comments: Off and on headaches for about a month       Last edited by Bart Lieu, CMA on 01/06/2024 11:14 AM.       Discussed the use of AI scribe software for clinical note transcription with the patient, who gave verbal consent to proceed.  History of Present Illness Brandy Barker is a 33 year old female who presents with intermittent headaches for the last month.  She has been experiencing intermittent headaches for the past month, described as a pressure-like sensation originating from the right maxillary area and extending to the right frontal portion of her head. The headaches occur every other day and can last all day, with ibuprofen  providing only partial relief. Movement or bending down exacerbates the headaches.  Two months ago, she underwent a tooth extraction on the right side of her mouth, which she initially thought was related to her headaches. Despite the extraction, the headaches have persisted. She denies any abnormal findings during follow-up after the extraction.  In the past two to three weeks, she has started experiencing pulsatile tinnitus, described as a 'whooshing' sound in her right ear, particularly noticeable in quiet environments. She has not used any sinus medications or nasal sprays, as she has not had issues with sinusitis before.  No fever, chills, or cough, except for a recent illness she suspects might have been the flu. She mentions that her lymph nodes felt slightly enlarged last week but have since improved. She also reports feeling pressure when pressing on the right side of her face, but no tenderness or  pain otherwise.     Past Medical History:  Diagnosis Date   DVT (deep venous thrombosis) (HCC)    Previous cesarean delivery, antepartum condition or complication 03/03/2013   Desires repeat     Protein S deficiency (HCC) 2016    Medications: Outpatient Medications Prior to Visit  Medication Sig   etonogestrel  (NEXPLANON ) 68 MG IMPL implant 1 each by Subdermal route once.   valACYclovir  (VALTREX ) 500 MG tablet Take 1 tablet (500 mg total) by mouth daily.   Ferrous Sulfate  (IRON PO) Take by mouth. (Patient not taking: Reported on 07/24/2023)   [DISCONTINUED] cephALEXin  (KEFLEX ) 500 MG capsule Take 1 capsule (500 mg total) by mouth 4 (four) times daily. (Patient not taking: Reported on 01/06/2024)   No facility-administered medications prior to visit.    Review of Systems  Last CBC Lab Results  Component Value Date   WBC 5.4 01/24/2023   HGB 13.3 01/24/2023   HCT 43.1 01/24/2023   MCV 83 01/24/2023   MCH 25.6 (L) 01/24/2023   RDW 14.7 01/24/2023   PLT 284 01/24/2023   Last metabolic panel Lab Results  Component Value Date   GLUCOSE 83 01/24/2023   NA 140 01/24/2023   K 4.5 01/24/2023   CL 105 01/24/2023   CO2 22 01/24/2023   BUN 11 01/24/2023   CREATININE 0.94 01/24/2023   EGFR 82 01/24/2023   CALCIUM 9.5 01/24/2023   PROT 7.4 01/24/2023   ALBUMIN 4.3 01/24/2023   LABGLOB 3.1  01/24/2023   AGRATIO 1.4 01/24/2023   BILITOT 0.6 01/24/2023   ALKPHOS 50 01/24/2023   AST 31 01/24/2023   ALT 44 (H) 01/24/2023   ANIONGAP 8 09/02/2017   Last lipids Lab Results  Component Value Date   CHOL 167 07/26/2020   HDL 46 07/26/2020   LDLCALC 109 (H) 07/26/2020   TRIG 63 07/26/2020   No results found for: "HGBA1C"  Last thyroid functions No results found for: "TSH", "T3TOTAL", "T4TOTAL", "THYROIDAB"      Objective    BP 118/72   Pulse 73   Ht 5\' 8"  (1.727 m)   Wt 234 lb (106.1 kg)   SpO2 100%   BMI 35.58 kg/m  BP Readings from Last 3 Encounters:  01/06/24  118/72  07/24/23 122/78  01/24/23 124/84   Wt Readings from Last 3 Encounters:  01/06/24 234 lb (106.1 kg)  01/24/23 221 lb 4.8 oz (100.4 kg)  01/09/23 223 lb (101.2 kg)        Physical Exam  Physical Exam HEENT: No cervical lymphadenopathy on right, enlarged LN on left posterior cervical chain. Right nasal turbinates enlarged. Right frontal sinus tenderness. Scant nasal congestion, normal bilateral TMs  without erythema nor evidence of effusion     No results found for any visits on 01/06/24.  Assessment & Plan     Problem List Items Addressed This Visit       Nervous and Auditory   Pulsatile tinnitus of right ear     Other   Nonintractable episodic headache   Other Visit Diagnoses       Acute non-recurrent pansinusitis    -  Primary   Relevant Medications   amoxicillin  (AMOXIL ) 875 MG tablet   fluticasone (FLONASE) 50 MCG/ACT nasal spray     Hypertrophy of nasal turbinates       Relevant Medications   fluticasone (FLONASE) 50 MCG/ACT nasal spray        Assessment & Plan Chronic sinusitis Intermittent right-sided headaches for the past month, associated with pressure sensation and pulsatile tinnitus, suggestive of sinusitis due to fluid accumulation and potential infection. Differential includes acute sinus infection versus chronic sinusitis. Examination reveals enlarged turbinates on the right side, indicating possible obstruction and pressure build-up. No fever, chills, or recent sinusitis history. Ibuprofen  provides partial relief, but symptoms persist. Discussed potential for chronic sinusitis if symptoms do not resolve with initial treatment, necessitating ENT evaluation and possible sinus CT scan. - Start Flonase mcg , two sprays per nostril daily, to reduce nasal obstruction and pressure. - Prescribe amoxicillin  875 mg twice daily for 7 days to address potential sinus infection. - Refer to ENT specialist for further evaluation  - f/u in 6 weeks with me if  symptoms not improved      Return in about 6 weeks (around 02/17/2024) for sinus HA .         Mimi Alt, MD  [facility] [phone] (phone) [phone] (fax)  [facility]

## 2024-01-28 ENCOUNTER — Ambulatory Visit: Admitting: Dermatology

## 2024-01-29 ENCOUNTER — Ambulatory Visit: Payer: 59 | Admitting: Dermatology

## 2024-02-06 ENCOUNTER — Other Ambulatory Visit: Payer: Self-pay

## 2024-02-06 ENCOUNTER — Encounter: Payer: Self-pay | Admitting: Adult Health

## 2024-02-06 ENCOUNTER — Ambulatory Visit: Admitting: Adult Health

## 2024-02-06 ENCOUNTER — Other Ambulatory Visit (HOSPITAL_COMMUNITY)
Admission: RE | Admit: 2024-02-06 | Discharge: 2024-02-06 | Disposition: A | Discharge status: Home / Self Care | Source: Ambulatory Visit | Attending: Adult Health | Admitting: Adult Health

## 2024-02-06 VITALS — BP 114/77 | HR 62 | Ht 63.5 in | Wt 235.0 lb

## 2024-02-06 DIAGNOSIS — Z01419 Encounter for gynecological examination (general) (routine) without abnormal findings: Secondary | ICD-10-CM | POA: Insufficient documentation

## 2024-02-06 DIAGNOSIS — R87810 Cervical high risk human papillomavirus (HPV) DNA test positive: Secondary | ICD-10-CM

## 2024-02-06 DIAGNOSIS — N898 Other specified noninflammatory disorders of vagina: Secondary | ICD-10-CM | POA: Diagnosis not present

## 2024-02-06 DIAGNOSIS — B3731 Acute candidiasis of vulva and vagina: Secondary | ICD-10-CM | POA: Insufficient documentation

## 2024-02-06 DIAGNOSIS — Z975 Presence of (intrauterine) contraceptive device: Secondary | ICD-10-CM

## 2024-02-06 LAB — POCT WET PREP (WET MOUNT): WBC, Wet Prep HPF POC: POSITIVE

## 2024-02-06 MED ORDER — FLUCONAZOLE 150 MG PO TABS
ORAL_TABLET | ORAL | 1 refills | Status: DC
  Filled 2024-02-06: qty 2, 3d supply, fill #0

## 2024-02-06 NOTE — Progress Notes (Signed)
 Patient ID: Brandy Barker, female   DOB: 06-29-1991, 33 y.o.   MRN: [id number] History of Present Illness: Brandy Barker is a 33 year old black female,married, [id number] in for a well woman gyn exam and pap. Her pap last year +HPV NILM.  PCP is Dr Dori Garbe   Current Medications, Allergies, Past Medical History, Past Surgical History, Family History and Social History were reviewed in [employer] record.     Review of Systems: Patient denies any headaches, hearing loss, fatigue, blurred vision, shortness of breath, chest pain, abdominal pain, problems with bowel movements, urination, or intercourse. No joint pain or mood swings.  Feels irritated at times inner labia area   Physical Exam:BP 114/77 (BP Location: Left Arm, Patient Position: Sitting, Cuff Size: Large)   Pulse 62   Ht 5' 3.5" (1.613 m)   Wt 235 lb (106.6 kg)   BMI 40.98 kg/m   General:  Well developed, well nourished, no acute distress Skin:  Warm and dry Neck:  Midline trachea, normal thyroid, good ROM, no lymphadenopathy Lungs; Clear to auscultation bilaterally Breast:  No dominant palpable mass, retraction, or nipple discharge Cardiovascular: Regular rate and rhythm Abdomen:  Soft, non tender, no hepatosplenomegaly Pelvic:  External genitalia is normal in appearance, no lesions.  The vagina has white discharge, wet prep:+WBC and yeast. Urethra has no lesions or masses. The cervix is smooth pap with HR HPV genotyping performed.  Uterus is felt to be normal size, shape, and contour.  No adnexal masses or tenderness noted.Bladder is non tender, no masses felt. Extremities/musculoskeletal:  No swelling or varicosities noted, no clubbing or cyanosis Psych:  No mood changes, alert and cooperative,seems happy AA is 1 Fall risk is low    02/06/2024   10:50 AM 01/06/2024   11:15 AM 01/09/2023    8:54 AM  Depression screen PHQ 2/9  Decreased Interest 0 1 0  Down, Depressed, Hopeless 0 1 0  PHQ - 2 Score 0 2 0   Altered sleeping 0 0 0  Tired, decreased energy 1 1 1   Change in appetite 1 1 1   Feeling bad or failure about yourself  0 1 0  Trouble concentrating 0 0 0  Moving slowly or fidgety/restless 0 0 0  Suicidal thoughts 0 0 0  PHQ-9 Score 2 5 2   Difficult doing work/chores  Not difficult at all        02/06/2024   10:50 AM 01/06/2024   11:15 AM 01/09/2023    8:55 AM  GAD 7 : Generalized Anxiety Score  Nervous, Anxious, on Edge 0 0 0  Control/stop worrying 0 1 0  Worry too much - different things 0 0 0  Trouble relaxing 0 0 0  Restless 0 0 0  Easily annoyed or irritable 0 1 0  Afraid - awful might happen 0 0 0  Total GAD 7 Score 0 2 0  Anxiety Difficulty  Not difficult at all     Upstream - 02/06/24 1047       Pregnancy Intention Screening   Does the patient want to become pregnant in the next year? No    Does the patient's partner want to become pregnant in the next year? No    Would the patient like to discuss contraceptive options today? No      Contraception Wrap Up   Current Method Hormonal Implant    End Method Hormonal Implant    Contraception Counseling Provided Yes  Examination chaperoned by Alphonso Aschoff LPN   Impression and plan: 1. Encounter for gynecological examination with Papanicolaou smear of cervix (Primary) Pap sent Physical in 1 year - Cytology - PAP( )  2. Nexplanon  in place Placed 06/22/21 She wants to get tubal, in the Fall, return in 3 months for consult with Dr Randolm Butte   3. Vaginal discharge +yeast  - POCT Wet Prep [place])  4. Yeast infection of the vagina Will rx diflucan  Meds ordered this encounter  Medications   fluconazole  (DIFLUCAN ) 150 MG tablet    Sig: Take 1 now, can repeat in 3 days if needed    Dispense:  2 tablet    Refill:  1    Supervising Provider:   Evalyn Hillier H [[id number]]    - POCT Wet Prep [facility])  5. Papanicolaou smear of cervix with positive high risk human papilloma virus (HPV) test Pap  sent

## 2024-02-11 LAB — CYTOLOGY - PAP
Adequacy: ABSENT
Comment: NEGATIVE
Comment: NEGATIVE
Comment: NEGATIVE
Diagnosis: UNDETERMINED
HPV 16: NEGATIVE
HPV 18 / 45: NEGATIVE
High risk HPV: POSITIVE — AB

## 2024-02-13 ENCOUNTER — Ambulatory Visit: Payer: Self-pay | Admitting: Adult Health

## 2024-02-13 DIAGNOSIS — R87610 Atypical squamous cells of undetermined significance on cytologic smear of cervix (ASC-US): Secondary | ICD-10-CM | POA: Insufficient documentation

## 2024-02-17 ENCOUNTER — Ambulatory Visit (INDEPENDENT_AMBULATORY_CARE_PROVIDER_SITE_OTHER): Admitting: Obstetrics & Gynecology

## 2024-02-17 ENCOUNTER — Ambulatory Visit: Admitting: Family Medicine

## 2024-02-17 ENCOUNTER — Encounter: Payer: Self-pay | Admitting: Obstetrics & Gynecology

## 2024-02-17 ENCOUNTER — Other Ambulatory Visit (HOSPITAL_COMMUNITY)
Admission: RE | Admit: 2024-02-17 | Discharge: 2024-02-17 | Disposition: A | Discharge status: Home / Self Care | Source: Ambulatory Visit | Attending: Obstetrics & Gynecology | Admitting: Obstetrics & Gynecology

## 2024-02-17 ENCOUNTER — Encounter: Payer: Self-pay | Admitting: Family Medicine

## 2024-02-17 VITALS — BP 117/68 | HR 77 | Ht 63.0 in | Wt 237.2 lb

## 2024-02-17 VITALS — BP 116/73 | HR 75 | Ht 63.0 in | Wt 237.0 lb

## 2024-02-17 DIAGNOSIS — R87610 Atypical squamous cells of undetermined significance on cytologic smear of cervix (ASC-US): Secondary | ICD-10-CM | POA: Insufficient documentation

## 2024-02-17 DIAGNOSIS — Z975 Presence of (intrauterine) contraceptive device: Secondary | ICD-10-CM

## 2024-02-17 DIAGNOSIS — R87810 Cervical high risk human papillomavirus (HPV) DNA test positive: Secondary | ICD-10-CM

## 2024-02-17 DIAGNOSIS — Z3009 Encounter for other general counseling and advice on contraception: Secondary | ICD-10-CM | POA: Diagnosis not present

## 2024-02-17 DIAGNOSIS — R519 Headache, unspecified: Secondary | ICD-10-CM | POA: Diagnosis not present

## 2024-02-17 DIAGNOSIS — H938X1 Other specified disorders of right ear: Secondary | ICD-10-CM | POA: Diagnosis not present

## 2024-02-17 DIAGNOSIS — N870 Mild cervical dysplasia: Secondary | ICD-10-CM | POA: Diagnosis not present

## 2024-02-17 NOTE — Progress Notes (Signed)
 Established patient visit   Patient: Brandy Barker   DOB: 1990/11/12   33 y.o. Female  MRN: [id number] Visit Date: 02/17/2024  Today's healthcare provider: Mimi Alt, MD   Chief Complaint  Patient presents with   Headache    Sinus HA things are better    Subjective     HPI     Headache    Additional comments: Sinus HA things are better       Last edited by Bart Lieu, CMA on 02/17/2024  4:10 PM.       Discussed the use of AI scribe software for clinical note transcription with the patient, who gave verbal consent to proceed.  History of Present Illness Brandy Barker is a 33 year old female who presents for follow-up of sinus headaches.  Her sinus headaches have improved significantly since the last visit. She continues to use Flonase , which effectively clears her nasal passages. Initially, she was prescribed amoxicillin  and referred to an ear, nose, and throat specialist.  She experiences occasional ear pounding, occurring once or twice every few days, and a sensation of ear fullness or stuffiness, which is described as random and less bothersome than before.  She is currently using Flonase  regularly, although it was not used on the day of the visit.     Past Medical History:  Diagnosis Date   DVT (deep venous thrombosis) (HCC)    Previous cesarean delivery, antepartum condition or complication 03/03/2013   Desires repeat     Protein S deficiency (HCC) 2016    Medications: Outpatient Medications Prior to Visit  Medication Sig   etonogestrel  (NEXPLANON ) 68 MG IMPL implant 1 each by Subdermal route once.   fluticasone  (FLONASE ) 50 MCG/ACT nasal spray Place 2 sprays into both nostrils daily.   valACYclovir  (VALTREX ) 500 MG tablet Take 1 tablet (500 mg total) by mouth daily.   No facility-administered medications prior to visit.    Review of Systems      Objective    BP 116/73   Pulse 75   Ht 5' 3 (1.6 m)   Wt 237 lb (107.5  kg)   SpO2 100%   BMI 41.98 kg/m      Physical Exam  Physical Exam HEENT: TM effusion in right ear, no infection signs. Nasal passages clear.    No results found for any visits on 02/17/24.  Assessment & Plan     Problem List Items Addressed This Visit   None Visit Diagnoses       Ear congestion, right    -  Primary   Relevant Orders   Ambulatory referral to ENT     Sinus headache           Assessment & Plan Chronic Intermittent Ear Congestion and Sinus Headaches Significant improvement in sinus headaches with infrequent ear pounding and occasional ear fullness. Symptoms are random and less frequent. Possible Eustachian tube dysfunction, requiring intervention if symptoms persist or worsen. Physical examination shows fluid bubbles in the right ear without infection. Discussed ENT referral for potential Eustachian tube dysfunction evaluation and intervention if fluid drainage is inadequate. [facility] ENT referral for evaluation of ear congestion and sinus headaches. - Continue Flonase  50mcg 2 sprays per nostril daily for nasal congestion relief.  General Health Maintenance Annual physical examination not yet completed this year. Regular annual physicals are important for overall health monitoring. - Schedule annual physical examination for August.     Return in about 2 months (around  04/18/2024) for CPE.         Mimi Alt, MD  [facility] [phone] (phone) [phone] (fax)  [facility]

## 2024-02-17 NOTE — Progress Notes (Signed)
 Patient name: Brandy Barker MRN [id number]  Date of birth: [birth year] Chief Complaint:   Colposcopy  History of Present Illness:   Rosette Bellavance is a 33 y.o. [id number] female being seen today for cervical dysplasia management.  Prior cytology:  02/2024: ASCUS, HPV positive other 01/2023: pap normal, HPV positive other  Denies postcoital or intermenstrual bleeding.  Menses tolerable with Nexplanon .  Desires to proceed with permanent sterilization- likely once kids are back in school.  Denies irregular discharge, itching or irritation.  Denies pelvic or abdominal pain.    Smoker:  No. New sexual partner:  No.    No LMP recorded. Patient has had an implant.     02/06/2024   10:50 AM 01/06/2024   11:15 AM 01/09/2023    8:54 AM 09/29/2020   11:08 AM 07/26/2020    9:25 AM  Depression screen PHQ 2/9  Decreased Interest 0 1 0 0 0  Down, Depressed, Hopeless 0 1 0 0 0  PHQ - 2 Score 0 2 0 0 0  Altered sleeping 0 0 0    Tired, decreased energy 1 1 1     Change in appetite 1 1 1     Feeling bad or failure about yourself  0 1 0    Trouble concentrating 0 0 0    Moving slowly or fidgety/restless 0 0 0    Suicidal thoughts 0 0 0    PHQ-9 Score 2 5 2     Difficult doing work/chores  Not difficult at all        Review of Systems:   Pertinent items are noted in HPI Denies fever/chills, dizziness, headaches, visual disturbances, fatigue, shortness of breath, chest pain, abdominal pain, vomiting,  problems with periods, bowel movements, urination, or intercourse unless otherwise stated above.  Pertinent History Reviewed:  Reviewed past medical,surgical, social, obstetrical and family history.  Reviewed problem list, medications and allergies. Physical Assessment:   Vitals:   02/17/24 1413  BP: 117/68  Pulse: 77  Weight: 237 lb 3.2 oz (107.6 kg)  Height: 5' 3 (1.6 m)  Body mass index is 42.02 kg/m.       Physical Examination:   General appearance: alert, well appearing, and in no  distress  Psych: mood appropriate, normal affect  Skin: warm & dry   Cardiovascular: normal heart rate noted  Respiratory: normal respiratory effort, no distress  Abdomen: soft, non-tender  Pelvic: VULVA: normal appearing vulva with no masses, tenderness or lesions, VAGINA: normal appearing vagina with normal color and discharge, no lesions, CERVIX: see colposcopy section  Extremities: no edema   Chaperone: Wendell Halt     Colposcopy Procedure Note  Indications: ASCUS, HPV+    Procedure Details  The risks and benefits of the procedure and Written informed consent obtained.  Speculum placed in vagina and excellent visualization of cervix achieved, cervix swabbed x 3 with acetic acid solution.  Findings: Adequate colposcopy is noted today.  TMZ zone present  Cervix: no visible lesions; ECC and cervical biopsies obtained.    Monsel's applied.  Adequate hemostasis noted  Specimens: ECC and 12 o'clock cervical biopsy obtained  Complications: none.  Colposcopic Impression: negative   Plan(Based on 2019 ASCCP recommendations) 1) ASCUS HPV, + -Discussed HPV- reviewed incidence and its potential to cause condylomas to dysplasia to cervical cancer -Reviewed degree of abnormal pap smears  -Discussed ASCCP guidelines and current recommendations for colposcopy -As above, inform consent obtained and procedure completed -biopsies obtained, further management pending results -Questions and concerns were  addressed  2) Contraceptive management -Nexplanon  in place -discussed permanent sterilization Risks of procedure discussed with patient including but not limited to: risk of regret, permanence of method, bleeding, infection, and potential injury to surrounding organs.  Discussed that especially in setting of salpingectomy, risk of ectopic pregnancy low, less than 1%.  Also discussed possibility of post-tubal pain syndrome. Patient verbalized understanding of these risks and wants  to proceed with sterilization- mostly likely early fall. []  advised f/u about 1 mo prior to desired date of surgery so we can confirm on everyone's schedule  Jeffre Enriques, DO Attending Obstetrician & Gynecologist, [facility] [employer], [facility]

## 2024-02-19 ENCOUNTER — Ambulatory Visit: Payer: Self-pay | Admitting: Obstetrics & Gynecology

## 2024-02-19 LAB — SURGICAL PATHOLOGY

## 2024-03-12 ENCOUNTER — Other Ambulatory Visit: Payer: Self-pay | Admitting: Family Medicine

## 2024-03-12 ENCOUNTER — Other Ambulatory Visit: Payer: Self-pay

## 2024-03-12 DIAGNOSIS — B009 Herpesviral infection, unspecified: Secondary | ICD-10-CM

## 2024-03-12 NOTE — Telephone Encounter (Signed)
 LOV 02/17/24 NOV 04/20/24 LRF 08/22/23 LABS 02/06/24

## 2024-03-16 ENCOUNTER — Encounter: Payer: Self-pay | Admitting: Family Medicine

## 2024-03-16 ENCOUNTER — Other Ambulatory Visit: Payer: Self-pay

## 2024-03-16 ENCOUNTER — Other Ambulatory Visit: Payer: Self-pay | Admitting: Family Medicine

## 2024-03-16 DIAGNOSIS — B009 Herpesviral infection, unspecified: Secondary | ICD-10-CM

## 2024-03-16 NOTE — Telephone Encounter (Signed)
 LOV 02/17/24 NOV 04/08/24 LRF 08/22/23 LABS 02/06/24

## 2024-03-16 NOTE — Telephone Encounter (Signed)
 Pt called for update on request, request was denied. Pt is in need of medication, please advise.

## 2024-03-17 ENCOUNTER — Other Ambulatory Visit: Payer: Self-pay

## 2024-03-17 ENCOUNTER — Other Ambulatory Visit: Payer: Self-pay | Admitting: Family Medicine

## 2024-03-17 DIAGNOSIS — B009 Herpesviral infection, unspecified: Secondary | ICD-10-CM

## 2024-03-17 MED FILL — Valacyclovir HCl Tab 500 MG: ORAL | 90 days supply | Qty: 90 | Fill #0 | Status: AC

## 2024-03-17 NOTE — Telephone Encounter (Signed)
Please see the message below and advise.

## 2024-03-19 ENCOUNTER — Other Ambulatory Visit: Payer: Self-pay

## 2024-04-08 ENCOUNTER — Ambulatory Visit: Admitting: Family Medicine

## 2024-04-20 ENCOUNTER — Encounter: Admitting: Family Medicine

## 2024-04-20 NOTE — Patient Instructions (Incomplete)
 It was a pleasure to see you today!  Thank you for choosing [facility] for your primary care.   Today you were seen for your annual physical  Please review the attached information regarding helpful preventive health topics.   To keep you healthy, please keep in mind the following health maintenance items that you are due for:   Health Maintenance Due  Topic Date Due   Hepatitis B Vaccines 19-59 Average Risk (2 of 3 - 3-dose series) 05/05/2007   HPV VACCINES (1 - 3-dose SCDM series) Never done   COVID-19 Vaccine (1 - 2024-25 season) Never done   INFLUENZA VACCINE  04/03/2024     Best Wishes,   Dr. Lang

## 2024-04-20 NOTE — Progress Notes (Deleted)
 Complete physical exam   Patient: Brandy Barker   DOB: 02/03/1991   33 y.o. Female  MRN: [id number] Visit Date: 04/20/2024  Today's healthcare provider: Rockie Agent, MD   No chief complaint on file.  Subjective    Brandy Barker is a 33 y.o. female who presents today for a complete physical exam.    She {does/does not:200015} have additional problems to discuss today.   Discussed the use of AI scribe software for clinical note transcription with the patient, who gave verbal consent to proceed.  History of Present Illness      Past Medical History:  Diagnosis Date   DVT (deep venous thrombosis) (HCC)    Previous cesarean delivery, antepartum condition or complication 03/03/2013   Desires repeat     Protein S deficiency (HCC) 2016   Past Surgical History:  Procedure Laterality Date   CESAREAN SECTION     CESAREAN SECTION Bilateral 04/15/2018   Procedure: REPEAT CESAREAN SECTION;  Surgeon: Edsel Norleen GAILS, MD;  Location: [facility];  Service: Obstetrics;  Laterality: Bilateral;   CESAREAN SECTION N/A    Phreesia 07/18/2020   cesearan     Social History   Socioeconomic History   Marital status: Married    Spouse name: Not on file   Number of children: Not on file   Years of education: Not on file   Highest education level: Some college, no degree  Occupational History   Occupation: Curator: [employer]  Tobacco Use   Smoking status: Never   Smokeless tobacco: Never  Vaping Use   Vaping status: Never Used  Substance and Sexual Activity   Alcohol use: No   Drug use: No   Sexual activity: Yes    Birth control/protection: Implant  Other Topics Concern   Not on file  Social History Narrative   Not on file   Social Drivers of Health   Financial Resource Strain: Low Risk  (02/17/2024)   Overall Financial Resource Strain (CARDIA)    Difficulty of Paying Living Expenses: Not very hard  Food Insecurity: No Food Insecurity  (02/17/2024)   Hunger Vital Sign    Worried About Running Out of Food in the Last Year: Never true    Ran Out of Food in the Last Year: Never true  Transportation Needs: No Transportation Needs (02/17/2024)   PRAPARE - Administrator, Civil Service (Medical): No    Lack of Transportation (Non-Medical): No  Physical Activity: Insufficiently Active (02/17/2024)   Exercise Vital Sign    Days of Exercise per Week: 2 days    Minutes of Exercise per Session: 30 min  Stress: No Stress Concern Present (02/17/2024)   [employer] of Occupational Health - Occupational Stress Questionnaire    Feeling of Stress: Not at all  Social Connections: Moderately Integrated (02/17/2024)   Social Connection and Isolation Panel    Frequency of Communication with Friends and Family: More than three times a week    Frequency of Social Gatherings with Friends and Family: Once a week    Attends Religious Services: 1 to 4 times per year    Active Member of [employer] or Organizations: No    Attends Banker Meetings: Not on file    Marital Status: Married  Catering manager Violence: Not At Risk (02/06/2024)   Humiliation, Afraid, Rape, and Kick questionnaire    Fear of Current or Ex-Partner: No    Emotionally Abused: No  Physically Abused: No    Sexually Abused: No   Family Status  Relation Name Status   PGF  Alive   PGM  Alive   MGM  Deceased   MGF  Deceased   Father  Alive   Mother  Alive   Brother  Alive   Sister  Alive       Blood Clot   Sister  Alive   Son  Alive   Son  Alive   Daughter  Alive   Daughter  Alive  No partnership data on file   Family History  Problem Relation Age of Onset   Cancer Paternal Grandfather    Diabetes Paternal Grandmother    Cancer Paternal Grandmother    Hypertension Father    Other Sister        DVT   No Known Allergies   Medications: Outpatient Medications Prior to Visit  Medication Sig   etonogestrel  (NEXPLANON ) 68 MG IMPL  implant 1 each by Subdermal route once.   fluticasone  (FLONASE ) 50 MCG/ACT nasal spray Place 2 sprays into both nostrils daily.   valACYclovir  (VALTREX ) 500 MG tablet Take 1 tablet (500 mg total) by mouth daily.   No facility-administered medications prior to visit.    Review of Systems  Last CBC Lab Results  Component Value Date   WBC 5.4 01/24/2023   HGB 13.3 01/24/2023   HCT 43.1 01/24/2023   MCV 83 01/24/2023   MCH 25.6 (L) 01/24/2023   RDW 14.7 01/24/2023   PLT 284 01/24/2023   Last metabolic panel Lab Results  Component Value Date   GLUCOSE 83 01/24/2023   NA 140 01/24/2023   K 4.5 01/24/2023   CL 105 01/24/2023   CO2 22 01/24/2023   BUN 11 01/24/2023   CREATININE 0.94 01/24/2023   EGFR 82 01/24/2023   CALCIUM 9.5 01/24/2023   PROT 7.4 01/24/2023   ALBUMIN 4.3 01/24/2023   LABGLOB 3.1 01/24/2023   AGRATIO 1.4 01/24/2023   BILITOT 0.6 01/24/2023   ALKPHOS 50 01/24/2023   AST 31 01/24/2023   ALT 44 (H) 01/24/2023   ANIONGAP 8 09/02/2017   Last lipids Lab Results  Component Value Date   CHOL 167 07/26/2020   HDL 46 07/26/2020   LDLCALC 109 (H) 07/26/2020   TRIG 63 07/26/2020   Last hemoglobin A1c No results found for: HGBA1C Last thyroid functions No results found for: TSH, T3TOTAL, T4TOTAL, THYROIDAB Last vitamin D No results found for: 25OHVITD2, 25OHVITD3, VD25OH Last vitamin B12 and Folate No results found for: VITAMINB12, FOLATE   {See past labs  Heme  Chem  Endocrine  Serology  Results Review (optional):1}  Objective    There were no vitals taken for this visit. BP Readings from Last 3 Encounters:  02/17/24 116/73  02/17/24 117/68  02/06/24 114/77   Wt Readings from Last 3 Encounters:  02/17/24 237 lb (107.5 kg)  02/17/24 237 lb 3.2 oz (107.6 kg)  02/06/24 235 lb (106.6 kg)    {See vitals history (optional):1}    Physical Exam  ***  Last depression screening scores    02/17/2024    4:16 PM 02/06/2024    10:50 AM 01/06/2024   11:15 AM  PHQ 2/9 Scores  PHQ - 2 Score 0 0 2  PHQ- 9 Score 2 2 5     Last fall risk screening    02/06/2024   10:47 AM  Fall Risk   Falls in the past year? 0  Number falls in past yr:  0  Injury with Fall? 0    Last Audit-C alcohol use screening    02/17/2024    3:05 PM  Alcohol Use Disorder Test (AUDIT)  1. How often do you have a drink containing alcohol? 1  2. How many drinks containing alcohol do you have on a typical day when you are drinking? 0  3. How often do you have six or more drinks on one occasion? 0  AUDIT-C Score 1      Patient-reported   A score of 3 or more in women, and 4 or more in men indicates increased risk for alcohol abuse, EXCEPT if all of the points are from question 1   No results found for any visits on 04/20/24.  Assessment & Plan    Routine Health Maintenance and Physical Exam  Immunization History  Administered Date(s) Administered   Influenza-Unspecified 07/13/2020   Tdap 01/29/2018    Health Maintenance  Topic Date Due   Hepatitis B Vaccines 19-59 Average Risk (2 of 3 - 3-dose series) 05/05/2007   HPV VACCINES (1 - 3-dose SCDM series) Never done   COVID-19 Vaccine (1 - 2024-25 season) Never done   INFLUENZA VACCINE  04/03/2024   DTaP/Tdap/Td (2 - Td or Tdap) 01/30/2028   Cervical Cancer Screening (HPV/Pap Cotest)  02/05/2029   Hepatitis C Screening  Completed   HIV Screening  Completed   Pneumococcal Vaccine  Aged Out   Meningococcal B Vaccine  Aged Out    Problem List Items Addressed This Visit   None   Assessment and Plan Assessment & Plan        No follow-ups on file.       Rockie Agent, MD  [facility] [phone] (phone) [phone] (fax)  [facility]

## 2024-06-23 ENCOUNTER — Ambulatory Visit: Admitting: Family Medicine

## 2024-06-23 ENCOUNTER — Encounter: Payer: Self-pay | Admitting: Family Medicine

## 2024-06-23 VITALS — BP 112/78 | HR 67 | Temp 98.6°F | Ht 63.0 in | Wt 235.4 lb

## 2024-06-23 DIAGNOSIS — Z1389 Encounter for screening for other disorder: Secondary | ICD-10-CM | POA: Diagnosis not present

## 2024-06-23 DIAGNOSIS — E66813 Obesity, class 3: Secondary | ICD-10-CM

## 2024-06-23 DIAGNOSIS — Z23 Encounter for immunization: Secondary | ICD-10-CM | POA: Diagnosis not present

## 2024-06-23 DIAGNOSIS — Z130 Encounter for screening for diseases of the blood and blood-forming organs and certain disorders involving the immune mechanism: Secondary | ICD-10-CM | POA: Diagnosis not present

## 2024-06-23 DIAGNOSIS — Z13220 Encounter for screening for lipoid disorders: Secondary | ICD-10-CM

## 2024-06-23 DIAGNOSIS — E611 Iron deficiency: Secondary | ICD-10-CM | POA: Diagnosis not present

## 2024-06-23 DIAGNOSIS — Z0001 Encounter for general adult medical examination with abnormal findings: Secondary | ICD-10-CM

## 2024-06-23 DIAGNOSIS — Z131 Encounter for screening for diabetes mellitus: Secondary | ICD-10-CM

## 2024-06-23 DIAGNOSIS — Z111 Encounter for screening for respiratory tuberculosis: Secondary | ICD-10-CM

## 2024-06-23 DIAGNOSIS — Z0000 Encounter for general adult medical examination without abnormal findings: Secondary | ICD-10-CM

## 2024-06-23 NOTE — Patient Instructions (Signed)
 To keep you healthy, please keep in mind the following health maintenance items that you are due for:   Health Maintenance Due  Topic Date Due   Hepatitis B Vaccines 19-59 Average Risk (2 of 3 - 19+ 3-dose series) 03/05/2018     Best Wishes,   Dr. Lang

## 2024-06-23 NOTE — Progress Notes (Unsigned)
 Complete physical exam   Patient: Brandy Barker   DOB: 12-May-1991   33 y.o. Female  MRN: [id number] Visit Date: 06/23/2024  Today's healthcare provider: Rockie Agent, MD   Chief Complaint  Patient presents with   Annual Exam    Patient is present for annual exam with pcp.  Diet is normal,could be better per patient. Some exercise  Vaccines: Hep B, HPV, Flu- Received flu at work, declines HPV. Please discuss with patient 2nd dose of hep b (first in 2019) Screenings: UTD  Requests TB screen for school- needs in January      Subjective    Brandy Barker is a 33 y.o. female who presents today for a complete physical exam.    She does not have additional problems to discuss today.   Discussed the use of AI scribe software for clinical note transcription with the patient, who gave verbal consent to proceed.  History of Present Illness Brandy Barker is a 33 year old female who presents for an annual physical exam.  She has a history of deep vein thrombosis (DVT), herpes simplex virus (HSV), iron deficiency, high risk human papillomavirus (HPV) on Pap smear, and protein S deficiency. She is currently using Nexplanon  for contraception.  She follows a general diet and engages in some exercise, although maintaining a consistent routine is challenging due to her work schedule, which includes night shifts. She is attempting to lose weight but finds it difficult. Her BMI is 41.  Regarding vaccinations, she has received a flu vaccine at work. She declines the HPV vaccine but is interested in completing her hepatitis B vaccination series, having received the first dose in 2019. She also requests tuberculosis screening for school, which will be needed in January.     Past Medical History:  Diagnosis Date   DVT (deep venous thrombosis) (HCC)    Previous cesarean delivery, antepartum condition or complication 03/03/2013   Desires repeat     Previous cesarean section 09/23/2013    X 3   For repeat, schedule_7:30 on 113 august___     Protein S deficiency 2016   Status post repeat low transverse cesarean section 04/15/2018   Past Surgical History:  Procedure Laterality Date   CESAREAN SECTION     CESAREAN SECTION Bilateral 04/15/2018   Procedure: REPEAT CESAREAN SECTION;  Surgeon: Edsel Norleen GAILS, MD;  Location: [facility];  Service: Obstetrics;  Laterality: Bilateral;   CESAREAN SECTION N/A 09/09/2008, 04/02/2011, 09/22/2013, 04/15/2018   Phreesia 07/18/2020   cesearan     Social History   Socioeconomic History   Marital status: Married    Spouse name: Not on file   Number of children: Not on file   Years of education: Not on file   Highest education level: Some college, no degree  Occupational History   Occupation: Curator: [employer]  Tobacco Use   Smoking status: Never   Smokeless tobacco: Never  Vaping Use   Vaping status: Never Used  Substance and Sexual Activity   Alcohol use: No   Drug use: No   Sexual activity: Yes    Birth control/protection: Implant  Other Topics Concern   Not on file  Social History Narrative   Not on file   Social Drivers of Health   Financial Resource Strain: Low Risk  (02/17/2024)   Overall Financial Resource Strain (CARDIA)    Difficulty of Paying Living Expenses: Not very hard  Food Insecurity: No Food Insecurity (02/17/2024)  Hunger Vital Sign    Worried About Running Out of Food in the Last Year: Never true    Ran Out of Food in the Last Year: Never true  Transportation Needs: No Transportation Needs (02/17/2024)   PRAPARE - Administrator, Civil Service (Medical): No    Lack of Transportation (Non-Medical): No  Physical Activity: Insufficiently Active (02/17/2024)   Exercise Vital Sign    Days of Exercise per Week: 2 days    Minutes of Exercise per Session: 30 min  Stress: No Stress Concern Present (02/17/2024)   [employer] of Occupational Health - Occupational  Stress Questionnaire    Feeling of Stress: Not at all  Social Connections: Moderately Integrated (02/17/2024)   Social Connection and Isolation Panel    Frequency of Communication with Friends and Family: More than three times a week    Frequency of Social Gatherings with Friends and Family: Once a week    Attends Religious Services: 1 to 4 times per year    Active Member of [employer] or Organizations: No    Attends Banker Meetings: Not on file    Marital Status: Married  Catering manager Violence: Not At Risk (02/06/2024)   Humiliation, Afraid, Rape, and Kick questionnaire    Fear of Current or Ex-Partner: No    Emotionally Abused: No    Physically Abused: No    Sexually Abused: No   Family Status  Relation Name Status   PGF granny Alive   PGM granny Alive   MGM  Deceased   MGF  Deceased   Father daddy Alive   Mother  Alive   Brother  Alive   Sister  Alive       Blood Clot   Sister  Alive   Son  Alive   Son  Alive   Daughter  Alive   Daughter  Alive  No partnership data on file   Family History  Problem Relation Age of Onset   Cancer Paternal Grandfather    Diabetes Paternal Grandmother    Cancer Paternal Grandmother    Hypertension Father    Other Sister        DVT   No Known Allergies   Medications: Outpatient Medications Prior to Visit  Medication Sig   etonogestrel  (NEXPLANON ) 68 MG IMPL implant 1 each by Subdermal route once.   fluticasone  (FLONASE ) 50 MCG/ACT nasal spray Place 2 sprays into both nostrils daily.   valACYclovir  (VALTREX ) 500 MG tablet Take 1 tablet (500 mg total) by mouth daily.   No facility-administered medications prior to visit.    Review of Systems  Last CBC Lab Results  Component Value Date   WBC 5.7 06/23/2024   HGB 12.8 06/23/2024   HCT 40.7 06/23/2024   MCV 85 06/23/2024   MCH 26.8 06/23/2024   RDW 15.0 06/23/2024   PLT 270 06/23/2024   Last metabolic panel Lab Results  Component Value Date   GLUCOSE 90  06/23/2024   NA 138 06/23/2024   K 4.4 06/23/2024   CL 106 06/23/2024   CO2 22 06/23/2024   BUN 12 06/23/2024   CREATININE 0.86 06/23/2024   EGFR 90 06/23/2024   CALCIUM 9.1 06/23/2024   PROT 7.0 06/23/2024   ALBUMIN 4.1 06/23/2024   LABGLOB 2.9 06/23/2024   AGRATIO 1.4 01/24/2023   BILITOT 0.7 06/23/2024   ALKPHOS 42 06/23/2024   AST 15 06/23/2024   ALT 21 06/23/2024   ANIONGAP 8 09/02/2017   Last lipids  Lab Results  Component Value Date   CHOL 168 06/23/2024   HDL 42 06/23/2024   LDLCALC 111 (H) 06/23/2024   TRIG 79 06/23/2024   CHOLHDL 4.0 06/23/2024   Last hemoglobin A1c Lab Results  Component Value Date   HGBA1C 6.0 (H) 06/23/2024   Last thyroid functions No results found for: TSH, T3TOTAL, T4TOTAL, FREET4, THYROIDAB Last vitamin D No results found for: 25OHVITD2, 25OHVITD3, VD25OH Last vitamin B12 and Folate No results found for: VITAMINB12, FOLATE     Objective    BP 112/78 (BP Location: Right Arm, Patient Position: Sitting, Cuff Size: Normal)   Pulse 67   Temp 98.6 F (37 C) (Oral)   Ht 5' 3 (1.6 m)   Wt 235 lb 6.4 oz (106.8 kg)   SpO2 100%   BMI 41.70 kg/m  BP Readings from Last 3 Encounters:  06/23/24 112/78  02/17/24 116/73  02/17/24 117/68   Wt Readings from Last 3 Encounters:  06/23/24 235 lb 6.4 oz (106.8 kg)  02/17/24 237 lb (107.5 kg)  02/17/24 237 lb 3.2 oz (107.6 kg)        Physical Exam Vitals reviewed.  Constitutional:      General: She is not in acute distress.    Appearance: Normal appearance. She is not ill-appearing, toxic-appearing or diaphoretic.  HENT:     Head: Normocephalic and atraumatic.     Right Ear: Tympanic membrane and external ear normal. There is no impacted cerumen.     Left Ear: Tympanic membrane and external ear normal. There is no impacted cerumen.     Nose: Nose normal.     Mouth/Throat:     Pharynx: Oropharynx is clear.  Eyes:     General: No scleral icterus.    Extraocular  Movements: Extraocular movements intact.     Conjunctiva/sclera: Conjunctivae normal.     Pupils: Pupils are equal, round, and reactive to light.  Cardiovascular:     Rate and Rhythm: Normal rate and regular rhythm.     Pulses: Normal pulses.     Heart sounds: Normal heart sounds. No murmur heard.    No friction rub. No gallop.  Pulmonary:     Effort: Pulmonary effort is normal. No respiratory distress.     Breath sounds: Normal breath sounds. No wheezing, rhonchi or rales.  Abdominal:     General: Bowel sounds are normal. There is no distension.     Palpations: Abdomen is soft. There is no mass.     Tenderness: There is no abdominal tenderness. There is no guarding.  Musculoskeletal:        General: No deformity.     Cervical back: Normal range of motion and neck supple.     Right lower leg: No edema.     Left lower leg: No edema.  Lymphadenopathy:     Cervical: No cervical adenopathy.  Skin:    General: Skin is warm.     Capillary Refill: Capillary refill takes less than 2 seconds.     Findings: No erythema or rash.  Neurological:     General: No focal deficit present.     Mental Status: She is alert and oriented to person, place, and time.     Cranial Nerves: Cranial nerves 2-12 are intact. No cranial nerve deficit or facial asymmetry.     Motor: Motor function is intact. No weakness.     Gait: Gait normal.  Psychiatric:        Mood and Affect: Mood normal.  Behavior: Behavior normal.       Last depression screening scores    06/23/2024    2:21 PM 02/17/2024    4:16 PM 02/06/2024   10:50 AM  PHQ 2/9 Scores  PHQ - 2 Score 1 0 0  PHQ- 9 Score 3 2 2     Last fall risk screening    06/23/2024    2:21 PM  Fall Risk   Falls in the past year? 0  Number falls in past yr: 0  Injury with Fall? 0  Risk for fall due to : No Fall Risks  Follow up Falls evaluation completed    Last Audit-C alcohol use screening    02/17/2024    3:05 PM  Alcohol Use Disorder  Test (AUDIT)  1. How often do you have a drink containing alcohol? 1  2. How many drinks containing alcohol do you have on a typical day when you are drinking? 0  3. How often do you have six or more drinks on one occasion? 0  AUDIT-C Score 1      Patient-reported   A score of 3 or more in women, and 4 or more in men indicates increased risk for alcohol abuse, EXCEPT if all of the points are from question 1   Results for orders placed or performed in visit on 06/23/24  Microscopic Examination  Result Value Ref Range   WBC, UA 0-5 0 - 5 /hpf   RBC, Urine None seen 0 - 2 /hpf   Epithelial Cells (non renal) >10 (A) 0 - 10 /hpf   Casts None seen None seen /lpf   Bacteria, UA Moderate (A) None seen/Few  Comprehensive Metabolic Panel (CMET)  Result Value Ref Range   Glucose 90 70 - 99 mg/dL   BUN 12 6 - 20 mg/dL   Creatinine, Ser 9.13 0.57 - 1.00 mg/dL   eGFR 90 >40 fO/fpw/8.26   BUN/Creatinine Ratio 14 9 - 23   Sodium 138 134 - 144 mmol/L   Potassium 4.4 3.5 - 5.2 mmol/L   Chloride 106 96 - 106 mmol/L   CO2 22 20 - 29 mmol/L   Calcium 9.1 8.7 - 10.2 mg/dL   Total Protein 7.0 6.0 - 8.5 g/dL   Albumin 4.1 3.9 - 4.9 g/dL   Globulin, Total 2.9 1.5 - 4.5 g/dL   Bilirubin Total 0.7 0.0 - 1.2 mg/dL   Alkaline Phosphatase 42 41 - 116 IU/L   AST 15 0 - 40 IU/L   ALT 21 0 - 32 IU/L  Lipid panel  Result Value Ref Range   Cholesterol, Total 168 100 - 199 mg/dL   Triglycerides 79 0 - 149 mg/dL   HDL 42 >60 mg/dL   VLDL Cholesterol Cal 15 5 - 40 mg/dL   LDL Chol Calc (NIH) 888 (H) 0 - 99 mg/dL   Chol/HDL Ratio 4.0 0.0 - 4.4 ratio  HgB A1c  Result Value Ref Range   Hgb A1c MFr Bld 6.0 (H) 4.8 - 5.6 %   Est. average glucose Bld gHb Est-mCnc 126 mg/dL  CBC w/Diff/Platelet  Result Value Ref Range   WBC 5.7 3.4 - 10.8 x10E3/uL   RBC 4.77 3.77 - 5.28 x10E6/uL   Hemoglobin 12.8 11.1 - 15.9 g/dL   Hematocrit 59.2 65.9 - 46.6 %   MCV 85 79 - 97 fL   MCH 26.8 26.6 - 33.0 pg   MCHC 31.4  (L) 31.5 - 35.7 g/dL   RDW 84.9 88.2 - 84.5 %  Platelets 270 150 - 450 x10E3/uL   Neutrophils 32 Not Estab. %   Lymphs 50 Not Estab. %   Monocytes 11 Not Estab. %   Eos 6 Not Estab. %   Basos 1 Not Estab. %   Neutrophils Absolute 1.8 1.4 - 7.0 x10E3/uL   Lymphocytes Absolute 2.9 0.7 - 3.1 x10E3/uL   Monocytes Absolute 0.6 0.1 - 0.9 x10E3/uL   EOS (ABSOLUTE) 0.3 0.0 - 0.4 x10E3/uL   Basophils Absolute 0.0 0.0 - 0.2 x10E3/uL   Immature Granulocytes 0 Not Estab. %   Immature Grans (Abs) 0.0 0.0 - 0.1 x10E3/uL  Urinalysis, Routine w reflex microscopic  Result Value Ref Range   Specific Gravity, UA 1.020 1.005 - 1.030   pH, UA 6.0 5.0 - 7.5   Color, UA Yellow Yellow   Appearance Ur Clear Clear   Leukocytes,UA 1+ (A) Negative   Protein,UA Negative Negative/Trace   Glucose, UA Negative Negative   Ketones, UA Negative Negative   RBC, UA Negative Negative   Bilirubin, UA Negative Negative   Urobilinogen, Ur 0.2 0.2 - 1.0 mg/dL   Nitrite, UA Negative Negative   Microscopic Examination See below:     Assessment & Plan    Routine Health Maintenance and Physical Exam  Immunization History  Administered Date(s) Administered   DTaP 09/17/1989, 12/06/1989, 02/10/1990, 03/29/1994, 04/23/2007   Dtap, Unspecified 09/17/1989, 12/06/1989, 02/10/1990, 03/29/1994, 04/23/2007   HIB, Unspecified 10/01/1990   Hep B, Unspecified 04/07/2007   Hepatitis B, ADULT 02/05/2018   Hepb-cpg 06/23/2024   Influenza, Seasonal, Injecte, Preservative Fre 06/01/2024   Influenza,inj,quad, With Preservative 01/29/2018   Influenza-Unspecified 07/13/2020   MMR 10/01/1990, 03/28/1991, 03/27/1994   Meningococcal Acwy, Unspecified 04/07/2007   PFIZER(Purple Top)SARS-COV-2 Vaccination 05/06/2020, 05/27/2020   PPD Test 03/02/2020   Polio, Unspecified 09/17/1989, 12/06/1989, 03/29/1994   Tdap 09/04/2013, 01/29/2018    Health Maintenance  Topic Date Due   COVID-19 Vaccine (3 - 2025-26 season) 07/09/2024  (Originally 05/04/2024)   HPV VACCINES (1 - 3-dose SCDM series) 06/23/2025 (Originally 06/11/2016)   DTaP/Tdap/Td (9 - Td or Tdap) 01/30/2028   Cervical Cancer Screening (HPV/Pap Cotest)  02/05/2029   Influenza Vaccine  Completed   Hepatitis C Screening  Completed   HIV Screening  Completed   Pneumococcal Vaccine  Aged Out   Meningococcal B Vaccine  Aged Out   Hepatitis B Vaccines 19-59 Average Risk  Discontinued    Problem List Items Addressed This Visit     Annual physical exam - Primary   Adult Wellness Visit Annual physical examination conducted. Blood pressure is 112/78 mmHg. BMI is 41, indicating class 3 obesity. Weight decreased by 2 pounds since June. Discussed general diet and exercise habits, noting challenges due to night work schedule. Labs ordered for comprehensive health assessment and school requirements. - Ordered CMP, lipid panel, A1c, CBC, urinalysis, and Quantiferon Gold for TB screening. - Administered second dose of hepatitis B vaccine to complete series. Physical Activity: Insufficiently Active (02/17/2024)   Exercise Vital Sign    Days of Exercise per Week: 2 days    Minutes of Exercise per Session: 30 min        Iron deficiency   Chronic  cBC ordered      Relevant Orders   CBC w/Diff/Platelet (Completed)   Obesity, Class III, BMI 40-49.9 (morbid obesity) (HCC)     Class 3 Obesity Chronic condition  BMI of 41 indicates class 3 obesity.  -Discussed challenges with weight management due to work schedule and lifestyle. -encourage  of moderate intensity physical activity per week and aim to increase to 240 mins per week for weight loss       Relevant Orders   Comprehensive Metabolic Panel (CMET) (Completed)   Other Visit Diagnoses       Screening for deficiency anemia       Relevant Orders   CBC w/Diff/Platelet (Completed)     Screening for diabetes mellitus       Relevant Orders   HgB A1c (Completed)     Screening for lipid disorders        Relevant Orders   Lipid panel (Completed)     Screening-pulmonary TB       Relevant Orders   QuantiFERON-TB Gold Plus     Screening for hematuria or proteinuria       Relevant Orders   Urinalysis, Routine w reflex microscopic (Completed)     Immunization due       Relevant Orders   Heplisav-B (HepB-CPG) Vaccine (Completed)       Assessment & Plan    Contraceptive Management (Nexplanon ) Nexplanon  in place for contraception. - Continue Nexplanon  for contraception.   Return in about 1 year (around 06/23/2025) for CPE.       Rockie Agent, MD  [facility] [phone] (phone) [phone] (fax)  [facility]

## 2024-06-24 LAB — CBC WITH DIFFERENTIAL/PLATELET
Basophils Absolute: 0 x10E3/uL (ref 0.0–0.2)
Basos: 1 %
EOS (ABSOLUTE): 0.3 x10E3/uL (ref 0.0–0.4)
Eos: 6 %
Hematocrit: 40.7 % (ref 34.0–46.6)
Hemoglobin: 12.8 g/dL (ref 11.1–15.9)
Immature Grans (Abs): 0 x10E3/uL (ref 0.0–0.1)
Immature Granulocytes: 0 %
Lymphocytes Absolute: 2.9 x10E3/uL (ref 0.7–3.1)
Lymphs: 50 %
MCH: 26.8 pg (ref 26.6–33.0)
MCHC: 31.4 g/dL — ABNORMAL LOW (ref 31.5–35.7)
MCV: 85 fL (ref 79–97)
Monocytes Absolute: 0.6 x10E3/uL (ref 0.1–0.9)
Monocytes: 11 %
Neutrophils Absolute: 1.8 x10E3/uL (ref 1.4–7.0)
Neutrophils: 32 %
Platelets: 270 x10E3/uL (ref 150–450)
RBC: 4.77 x10E6/uL (ref 3.77–5.28)
RDW: 15 % (ref 11.7–15.4)
WBC: 5.7 x10E3/uL (ref 3.4–10.8)

## 2024-06-24 LAB — COMPREHENSIVE METABOLIC PANEL WITH GFR
ALT: 21 IU/L (ref 0–32)
AST: 15 IU/L (ref 0–40)
Albumin: 4.1 g/dL (ref 3.9–4.9)
Alkaline Phosphatase: 42 IU/L (ref 41–116)
BUN/Creatinine Ratio: 14 (ref 9–23)
BUN: 12 mg/dL (ref 6–20)
Bilirubin Total: 0.7 mg/dL (ref 0.0–1.2)
CO2: 22 mmol/L (ref 20–29)
Calcium: 9.1 mg/dL (ref 8.7–10.2)
Chloride: 106 mmol/L (ref 96–106)
Creatinine, Ser: 0.86 mg/dL (ref 0.57–1.00)
Globulin, Total: 2.9 g/dL (ref 1.5–4.5)
Glucose: 90 mg/dL (ref 70–99)
Potassium: 4.4 mmol/L (ref 3.5–5.2)
Sodium: 138 mmol/L (ref 134–144)
Total Protein: 7 g/dL (ref 6.0–8.5)
eGFR: 90 mL/min/1.73 (ref >59)

## 2024-06-24 LAB — URINALYSIS, ROUTINE W REFLEX MICROSCOPIC
Bilirubin, UA: NEGATIVE
Glucose, UA: NEGATIVE
Ketones, UA: NEGATIVE
Nitrite, UA: NEGATIVE
Protein,UA: NEGATIVE
RBC, UA: NEGATIVE
Specific Gravity, UA: 1.02 (ref 1.005–1.030)
Urobilinogen, Ur: 0.2 mg/dL (ref 0.2–1.0)
pH, UA: 6 (ref 5.0–7.5)

## 2024-06-24 LAB — LIPID PANEL
Chol/HDL Ratio: 4 ratio (ref 0.0–4.4)
Cholesterol, Total: 168 mg/dL (ref 100–199)
HDL: 42 mg/dL (ref >39)
LDL Chol Calc (NIH): 111 mg/dL — ABNORMAL HIGH (ref 0–99)
Triglycerides: 79 mg/dL (ref 0–149)
VLDL Cholesterol Cal: 15 mg/dL (ref 5–40)

## 2024-06-24 LAB — MICROSCOPIC EXAMINATION
Casts: NONE SEEN /LPF
Epithelial Cells (non renal): >10 /HPF — AB (ref 0–10)
RBC, Urine: NONE SEEN /HPF (ref 0–2)

## 2024-06-24 LAB — HEMOGLOBIN A1C
Est. average glucose Bld gHb Est-mCnc: 126 mg/dL
Hgb A1c MFr Bld: 6 % — ABNORMAL HIGH (ref 4.8–5.6)

## 2024-06-25 MED FILL — Valacyclovir HCl Tab 500 MG: ORAL | 90 days supply | Qty: 90 | Fill #1 | Status: AC

## 2024-06-26 ENCOUNTER — Ambulatory Visit: Payer: Self-pay | Admitting: Family Medicine

## 2024-06-26 DIAGNOSIS — Z0000 Encounter for general adult medical examination without abnormal findings: Secondary | ICD-10-CM | POA: Insufficient documentation

## 2024-06-26 DIAGNOSIS — E66813 Obesity, class 3: Secondary | ICD-10-CM | POA: Insufficient documentation

## 2024-06-26 NOTE — Assessment & Plan Note (Signed)
 Chronic  cBC ordered

## 2024-06-26 NOTE — Assessment & Plan Note (Signed)
 Adult Wellness Visit Annual physical examination conducted. Blood pressure is 112/78 mmHg. BMI is 41, indicating class 3 obesity. Weight decreased by 2 pounds since June. Discussed general diet and exercise habits, noting challenges due to night work schedule. Labs ordered for comprehensive health assessment and school requirements. - Ordered CMP, lipid panel, A1c, CBC, urinalysis, and Quantiferon Gold for TB screening. - Administered second dose of hepatitis B vaccine to complete series. Physical Activity: Insufficiently Active (02/17/2024)   Exercise Vital Sign    Days of Exercise per Week: 2 days    Minutes of Exercise per Session: 30 min

## 2024-06-26 NOTE — Assessment & Plan Note (Signed)
   Class 3 Obesity Chronic condition  BMI of 41 indicates class 3 obesity.  -Discussed challenges with weight management due to work schedule and lifestyle. -encourage of moderate intensity physical activity per week and aim to increase to 240 mins per week for weight loss

## 2024-07-01 ENCOUNTER — Encounter: Payer: Self-pay | Admitting: Adult Health

## 2024-07-01 ENCOUNTER — Ambulatory Visit: Admitting: Adult Health

## 2024-07-01 VITALS — BP 119/79 | HR 72 | Ht 63.0 in | Wt 237.0 lb

## 2024-07-01 DIAGNOSIS — Z3202 Encounter for pregnancy test, result negative: Secondary | ICD-10-CM | POA: Diagnosis not present

## 2024-07-01 DIAGNOSIS — Z3046 Encounter for surveillance of implantable subdermal contraceptive: Secondary | ICD-10-CM | POA: Diagnosis not present

## 2024-07-01 LAB — POCT URINE PREGNANCY: Preg Test, Ur: NEGATIVE

## 2024-07-01 MED ORDER — ETONOGESTREL 68 MG ~~LOC~~ IMPL
68.0000 mg | DRUG_IMPLANT | Freq: Once | SUBCUTANEOUS | Status: AC
  Administered 2024-07-01: 68 mg via SUBCUTANEOUS

## 2024-07-01 NOTE — Patient Instructions (Signed)
Use condoms x 2 weeks, keep clean and dry x 24 hours, no heavy lifting, keep steri strips on x 72 hours, Keep pressure dressing on x 24 hours. Follow up prn problems.  

## 2024-07-01 NOTE — Progress Notes (Signed)
  Subjective:     Patient ID: Brandy Barker, female   DOB: 31-May-1991, 33 y.o.   MRN: [id number]  HPI   Review of Systems     Objective:   Physical Exam BP 119/79 (BP Location: Right Arm, Patient Position: Sitting, Cuff Size: Large)   Pulse 72   Ht 5' 3 (1.6 m)   Wt 237 lb (107.5 kg)   LMP 06/17/2024   BMI 41.98 kg/m  UPT is negative. Consent signed and time out called. Left arm cleansed with betadine, and injected with 2.5 cc 2% lidocaine and waited til numb.Under sterile technique a #11 blade was used to make small vertical incision, and a curved forceps was used to remove rod. Then new rod inserted and palpated by provider and pt.Steri strips applied. Pressure dressing applied.      Upstream - 07/01/24 1057       Pregnancy Intention Screening   Does the patient want to become pregnant in the next year? No    Does the patient's partner want to become pregnant in the next year? No    Would the patient like to discuss contraceptive options today? No      Contraception Wrap Up   Current Method Hormonal Implant    End Method Hormonal Implant    Contraception Counseling Provided Yes          Assessment:     1. Negative pregnancy test  - POCT urine pregnancy  2. Encounter for removal and reinsertion of Nexplanon  (Primary) Removed and reinserted nexplanon  Lot [id number] Exp 2027-06 Use condoms x 2 weeks, keep clean and dry x 24 hours, no heavy lifting, keep steri strips on x 72 hours, Keep pressure dressing on x 24 hours. Follow up prn problems.     Plan:     Return in 8 months for pap and physical

## 2024-07-07 ENCOUNTER — Other Ambulatory Visit: Payer: Self-pay

## 2024-07-07 DIAGNOSIS — Z020 Encounter for examination for admission to educational institution: Secondary | ICD-10-CM

## 2024-07-17 ENCOUNTER — Telehealth: Payer: Self-pay

## 2024-07-17 LAB — MICROALBUMIN / CREATININE URINE RATIO
Creatinine, Urine: 27.6 mg/dL
Microalb/Creat Ratio: <11 mg/g{creat} (ref 0–29)
Microalbumin, Urine: <3 ug/mL

## 2024-07-17 LAB — SPECIMEN STATUS REPORT

## 2024-07-17 NOTE — Telephone Encounter (Signed)
 Have called and left detailed voicemail for patient to let her know that school forms are complete and ready to pick up at the front in [address].

## 2024-07-20 ENCOUNTER — Ambulatory Visit: Payer: Self-pay | Admitting: Family Medicine

## 2024-09-07 ENCOUNTER — Other Ambulatory Visit: Payer: Self-pay

## 2024-09-07 DIAGNOSIS — Z020 Encounter for examination for admission to educational institution: Secondary | ICD-10-CM

## 2024-09-08 ENCOUNTER — Ambulatory Visit: Payer: Self-pay | Admitting: Family Medicine

## 2024-09-09 LAB — QUANTIFERON-TB GOLD PLUS
QuantiFERON Mitogen Value: >10 [IU]/mL
QuantiFERON Nil Value: 0.04 [IU]/mL
QuantiFERON TB1 Ag Value: 0.05 [IU]/mL
QuantiFERON TB2 Ag Value: 0.06 [IU]/mL

## 2024-09-09 LAB — VARICELLA ZOSTER ANTIBODY, IGG: Varicella zoster IgG: REACTIVE

## 2024-09-10 ENCOUNTER — Encounter: Payer: Self-pay | Admitting: Family Medicine

## 2024-10-09 MED FILL — Valacyclovir HCl Tab 500 MG: ORAL | 90 days supply | Qty: 90 | Fill #2 | Status: AC

## 2025-06-28 ENCOUNTER — Encounter: Admitting: Family Medicine
